# Patient Record
Sex: Female | Born: 1980 | Race: White | Hispanic: No | Marital: Married | Smoking: Never smoker
Health system: Southern US, Community
[De-identification: ages and names within clinical notes are randomized; demographics above are authoritative.]

## PROBLEM LIST (undated history)

## (undated) DIAGNOSIS — Z789 Other specified health status: Secondary | ICD-10-CM

## (undated) DIAGNOSIS — Z309 Encounter for contraceptive management, unspecified: Secondary | ICD-10-CM

## (undated) DIAGNOSIS — J45909 Unspecified asthma, uncomplicated: Secondary | ICD-10-CM

## (undated) DIAGNOSIS — O09529 Supervision of elderly multigravida, unspecified trimester: Secondary | ICD-10-CM

## (undated) DIAGNOSIS — J683 Other acute and subacute respiratory conditions due to chemicals, gases, fumes and vapors: Secondary | ICD-10-CM

## (undated) DIAGNOSIS — N644 Mastodynia: Secondary | ICD-10-CM

## (undated) DIAGNOSIS — R5383 Other fatigue: Secondary | ICD-10-CM

## (undated) DIAGNOSIS — T7840XA Allergy, unspecified, initial encounter: Secondary | ICD-10-CM

## (undated) HISTORY — DX: Other fatigue: R53.83

## (undated) HISTORY — PX: DILATION AND CURETTAGE OF UTERUS: SHX78

## (undated) HISTORY — PX: OTHER SURGICAL HISTORY: SHX169

## (undated) HISTORY — DX: Allergy, unspecified, initial encounter: T78.40XA

## (undated) HISTORY — DX: Unspecified asthma, uncomplicated: J45.909

## (undated) HISTORY — DX: Mastodynia: N64.4

## (undated) HISTORY — DX: Supervision of elderly multigravida, unspecified trimester: O09.529

## (undated) HISTORY — DX: Encounter for contraceptive management, unspecified: Z30.9

## (undated) HISTORY — DX: Other acute and subacute respiratory conditions due to chemicals, gases, fumes and vapors: J68.3

---

## 2006-03-06 ENCOUNTER — Encounter (INDEPENDENT_AMBULATORY_CARE_PROVIDER_SITE_OTHER): Payer: Self-pay | Admitting: Specialist

## 2006-03-06 ENCOUNTER — Ambulatory Visit (HOSPITAL_COMMUNITY): Admission: RE | Admit: 2006-03-06 | Discharge: 2006-03-06 | Payer: Self-pay | Admitting: Obstetrics & Gynecology

## 2006-12-10 ENCOUNTER — Other Ambulatory Visit: Admission: RE | Admit: 2006-12-10 | Discharge: 2006-12-10 | Payer: Self-pay | Admitting: Obstetrics and Gynecology

## 2007-03-25 ENCOUNTER — Inpatient Hospital Stay (HOSPITAL_COMMUNITY): Admission: AD | Admit: 2007-03-25 | Discharge: 2007-03-28 | Payer: Self-pay | Admitting: Obstetrics & Gynecology

## 2007-03-25 ENCOUNTER — Encounter: Payer: Self-pay | Admitting: Obstetrics & Gynecology

## 2007-03-25 ENCOUNTER — Ambulatory Visit: Payer: Self-pay | Admitting: Obstetrics and Gynecology

## 2008-02-17 ENCOUNTER — Other Ambulatory Visit: Admission: RE | Admit: 2008-02-17 | Discharge: 2008-02-17 | Payer: Self-pay | Admitting: Obstetrics and Gynecology

## 2008-08-09 ENCOUNTER — Ambulatory Visit (HOSPITAL_COMMUNITY): Admission: RE | Admit: 2008-08-09 | Discharge: 2008-08-09 | Payer: Self-pay | Admitting: Obstetrics & Gynecology

## 2009-05-09 ENCOUNTER — Other Ambulatory Visit: Admission: RE | Admit: 2009-05-09 | Discharge: 2009-05-09 | Payer: Self-pay | Admitting: Obstetrics and Gynecology

## 2009-12-27 ENCOUNTER — Ambulatory Visit: Payer: Self-pay | Admitting: Advanced Practice Midwife

## 2009-12-27 ENCOUNTER — Inpatient Hospital Stay (HOSPITAL_COMMUNITY): Admission: AD | Admit: 2009-12-27 | Discharge: 2009-12-29 | Payer: Self-pay | Admitting: Obstetrics and Gynecology

## 2010-06-22 LAB — CBC
HCT: 35.2 % — ABNORMAL LOW (ref 36.0–46.0)
MCH: 28.1 pg (ref 26.0–34.0)
MCV: 85.2 fL (ref 78.0–100.0)
RDW: 18.6 % — ABNORMAL HIGH (ref 11.5–15.5)
WBC: 17.3 10*3/uL — ABNORMAL HIGH (ref 4.0–10.5)

## 2010-08-22 NOTE — Op Note (Signed)
NAME:  Bailey Sullivan, Bailey Sullivan NO.:  [id number]   MEDICAL RECORD NO.:  [id number]          PATIENT TYPE:  INP   LOCATION:  9133                          FACILITY:  [facility]   PHYSICIAN:  Allie Bossier, MD        DATE OF BIRTH:  Sep 04, 1980   DATE OF PROCEDURE:  03/25/2007  DATE OF DISCHARGE:                               OPERATIVE REPORT   PREOPERATIVE DIAGNOSIS:  Fetal bradycardia at term, remote from  delivery, meconium.   POSTOP DIAGNOSIS:  Fetal bradycardia at term, remote from delivery,  meconium plus nuchal cord times one.   PROCEDURE:  Primary low transverse cesarean section.   SURGEON:  Nicholaus Bloom, M.D.   ASSISTANT:  Caren Griffins, C.N.M.   ANESTHESIA:  Oddono, MD, epidural.   COMPLICATIONS:  None.   ESTIMATED BLOOD LOSS:  500 mL.   SPECIMENS:  Cord blood, placenta and cord gas (7.31).  Please note the  patient is also a cord blood donor.   DETAILED PROCEDURE AND FINDINGS:  The fetal heart rate was noted to be  in the 70s for a prolonged deceleration (approximately 7 minutes).  It  responded initially to terbutaline and oxygen but recurrent deceleration  was noted.  She was taken to the operating room for a primary low  transverse cesarean section.  Her epidural was bolused for surgery  rapidly.  Her abdomen was prepped and draped in the usual sterile  fashion.  Her Foley catheter drained clear urine throughout.  Her  abdomen was prepped and draped in the usual sterile fashion.  After  adequate anesthesia was assured a transverse incision was made  approximately 2 cm above the symphysis pubis.  Incision was carried down  through the moderate amount of subcutaneous tissue to the fascia.  The  fascia was scored in the midline.  Fascial incision was extended  bilaterally  in the midline. The rectus muscles were separated in the  midline in a transverse fashion using electrosurgical technique.  Approximately 30% of the muscles in the midline was transected.  The  rest was left intact.   The peritoneum was entered with hemostats.  Peritoneal incision was  extended with the Bovie taking care to avoid the bladder.  Bladder blade  was placed.  A transverse incision was made on the well-developed lower  uterine segment.  Amniotomy was performed with hemostats.  Moderately  thick meconium was noted.  The baby had a vertex lie.  It was not  engaged in the pelvis yet.  Nuchal cord x1 was removed.  Mouth and  nostrils were suctioned prior to delivery of the shoulders.  Cord was  clamped and cut and the baby was transferred to the pediatrician for  care.  Apgars were 9 and 9 at one and five minutes.  Weight is pending.  Cord blood gas was obtained and the result was 7.31.   The placenta was extracted manually and given to the cord blood donor  personnel.  The uterus was left in situ.  The uterine interior was  cleaned with a dry lap sponge.  The bladder blade was placed and the  uterine incision was closed with two layers of zero chromic running  locking suture.  The second layer imbricated the first and excellent  hemostasis was noted.  The fascia was closed with a 0-0 Vicryl running  nonlocking suture.  No defects were palpable.  The subcu tissue was  irrigated, cleaned and dried.  It was infiltrated with 30 mL of 0.5%  Marcaine.  A subcuticular closure was done with 3-0 Vicryl suture.  Steri-Strips were placed across the skin incision and she was taken to  recovery room in stable condition.  The instrument, sponge and needle  counts were correct.  A Foley catheter drained clear urine throughout.  She tolerated the procedure well.      Allie Bossier, MD  Electronically Signed     MCD/MEDQ  D:  03/25/2007  T:  03/25/2007  Job:  [id number]

## 2010-08-22 NOTE — Discharge Summary (Signed)
[name], [name] NO.:  [id number]   MEDICAL RECORD NO.:  [id number]          PATIENT TYPE:  INP   LOCATION:  9133                          [facility]ACILITY:  [facility]   PHYSICIAN:  Bailey Sullivan, M.D. DATE OF BIRTH:  1981/01/10   DATE OF ADMISSION:  03/25/2007  DATE OF DISCHARGE:  03/28/2007                               DISCHARGE SUMMARY   REASON FOR ADMISSION:  Onset of labor.   PRENATAL PROCEDURES:  None.   HOSPITAL COURSE:  The patient is a 30 year old, G2, P0, admitted on  March 25, 2007 for spontaneous rupture of membranes at 2300 hours on  December 15.  The patient received prenatal care per [facility] with  onset of care at six weeks.  The patient's labor was augmented with  pitocin, however, the fetus had an episode of bradycardia lasting 7  minutes.  The bradycardia resolved with repositioning, and when pitocin  was turned off, a shot of terbutaline was given, but a second  deceleration occurred.  The patient was then taken to the operating room  for low transverse C-section.  Please see operative report for details  of the C-section.  A viable female infant with Apgars of 9 at 1 minute  and 9 at 5 minutes was delivered, and there was a nuchal cord x 1.  Three-vessel cord and the placenta were also delivered.  Estimated blood  loss was 100 cc.   PRENATAL LABS:  Blood type A positive, antibiotic negative, rubella  immune.  Hepatitis B surface antigen negative, RPR negative, HIV  negative.  Gonorrhea negative, chlamydia negative, group beta Strep  negative, one hour glucose tolerance test was 136.   The patient is breast feeding infant which is going well.  Her  contraception choice is Micronor pills.   POSTPARTUM PROCEDURES:  None.   COMPLICATIONS:  None.   DISCHARGE DIAGNOSIS:  Term pregnancy delivered.   DISCHARGE INFORMATION:  ACTIVITY:  No intercourse for six weeks.  DIET:  Routine.   DISCHARGE MEDICATIONS:  1. Micronor 0.35 take 1 tab at  the same time each day, start the      second Sunday after delivery.  2. Colace 100 mg take 1 tab b.i.d. as needed for constipation.  3. Ibuprofen 600 mg take 1 tab p.o. q. 6 hours as needed for mild      pain.  4. Percocet 5/325 take 1 tab q. 6 hours p.r.n. for moderate pain.  5. Continue prenatal vitamins while breast feeding.   DISCHARGE STATUS:  Well.   DISCHARGE INSTRUCTIONS:  Routine.  Discharge home.  The patient is to  follow up in six weeks at [facility].      Bailey Muir, MD      Bailey Sullivan, M.D.  Electronically Signed    SO/MEDQ  D:  03/28/2007  T:  03/28/2007  Job:  [id number]

## 2010-08-25 NOTE — Op Note (Signed)
Bailey, WINBORNE NO.:  [id number]   MEDICAL RECORD NO.:  [id number]          PATIENT TYPE:  AMB   LOCATION:  DAY                           FACILITY:  [facility]   PHYSICIAN:  Lazaro Arms, M.D.   DATE OF BIRTH:  09/20/1980   DATE OF PROCEDURE:  03/06/2006  DATE OF DISCHARGE:                               OPERATIVE REPORT   PREOPERATIVE DIAGNOSIS:  Missed abortion at 9 weeks.   POSTOPERATIVE DIAGNOSIS:  Missed abortion at 9 weeks.   PROCEDURE:  Cervical dilatation with suction and sharp curettage.   SURGEON:  Lazaro Arms, M.D.   ANESTHESIA:  General endotracheal.   FINDINGS:  The patient was seen in the office 2 days ago, had a previous  ultrasound 2 weeks ago which revealed positive cardiac activity.  Two  days ago there was no cardiac activity, no interval growth.  This  represents a missed AB, and they requested a D&C.   DESCRIPTION OF OPERATION:  The patient was taken to the operating room  and placed in the supine position, where she underwent general  endotracheal anesthesia, placed in the dorsal lithotomy position, and  prepped and draped in the usual sterile fashion.  A Graves speculum was  placed.  The cervix was grasped with a single-tooth tenaculum, a  paracervical block was placed, the cervix dilated serially to allow  passage of a 9 curved French suction curette.  Multiple passes were made  and products of conception were removed.  There was good uterine cry in  all areas.  The patient tolerated the procedure well.  She experienced  no bleeding, taken to recovery in good and stable condition, all counts  correct.      Lazaro Arms, M.D.  Electronically Signed     LHE/MEDQ  D:  03/06/2006  T:  03/06/2006  Job:  [id number]

## 2011-01-12 LAB — CBC
HCT: 28.5 — ABNORMAL LOW
MCHC: 34.6
MCV: 89.2
MCV: 89.6
Platelets: 286
RBC: 3.78 — ABNORMAL LOW
RDW: 15.2
RDW: 15.3

## 2011-01-12 LAB — RPR: RPR Ser Ql: NONREACTIVE

## 2011-01-23 ENCOUNTER — Other Ambulatory Visit: Payer: Self-pay | Admitting: Adult Health

## 2011-01-23 ENCOUNTER — Other Ambulatory Visit (HOSPITAL_COMMUNITY)
Admission: RE | Admit: 2011-01-23 | Discharge: 2011-01-23 | Disposition: A | Discharge status: Home / Self Care | Payer: 59 | Source: Ambulatory Visit | Attending: Obstetrics and Gynecology | Admitting: Obstetrics and Gynecology

## 2011-01-23 DIAGNOSIS — Z01419 Encounter for gynecological examination (general) (routine) without abnormal findings: Secondary | ICD-10-CM | POA: Insufficient documentation

## 2011-04-10 NOTE — L&D Delivery Note (Signed)
Delivery Note At 2:32 AM a viable and healthy female was delivered via Vaginal, Spontaneous Delivery (Presentation: ; Occiput Anterior).  APGAR: 8, 9; weight pending .   Placenta status: Intact, Spontaneous.  Cord: 3 vessels with the following complications: None.   Anesthesia:   Episiotomy: None Lacerations: 2nd degree perineal Suture Repair: 2.0 vicryl Est. Blood Loss (mL): 250  Mom to postpartum.  Baby to nursery-stable.  Bailey Sullivan 01/04/2012, 3:05 AM

## 2011-04-10 NOTE — L&D Delivery Note (Signed)
I was present for delivery and agree with above  

## 2011-05-16 LAB — OB RESULTS CONSOLE HEPATITIS B SURFACE ANTIGEN: Hepatitis B Surface Ag: NEGATIVE

## 2011-05-16 LAB — OB RESULTS CONSOLE ANTIBODY SCREEN: Antibody Screen: NEGATIVE

## 2011-05-16 LAB — OB RESULTS CONSOLE ABO/RH: RH Type: POSITIVE

## 2011-12-20 LAB — OB RESULTS CONSOLE GC/CHLAMYDIA: Chlamydia: NEGATIVE

## 2012-01-04 ENCOUNTER — Inpatient Hospital Stay (HOSPITAL_COMMUNITY)
Admission: AD | Admit: 2012-01-04 | Discharge: 2012-01-05 | DRG: 775 | Disposition: A | Discharge status: Home / Self Care | Payer: 59 | Source: Ambulatory Visit | Attending: Obstetrics & Gynecology | Admitting: Obstetrics & Gynecology

## 2012-01-04 ENCOUNTER — Encounter (HOSPITAL_COMMUNITY): Payer: Self-pay | Admitting: *Deleted

## 2012-01-04 ENCOUNTER — Inpatient Hospital Stay (HOSPITAL_COMMUNITY): Payer: 59 | Admitting: Anesthesiology

## 2012-01-04 ENCOUNTER — Encounter (HOSPITAL_COMMUNITY): Payer: Self-pay | Admitting: Anesthesiology

## 2012-01-04 DIAGNOSIS — IMO0001 Reserved for inherently not codable concepts without codable children: Secondary | ICD-10-CM

## 2012-01-04 HISTORY — DX: Other specified health status: Z78.9

## 2012-01-04 LAB — TYPE AND SCREEN: Antibody Screen: NEGATIVE

## 2012-01-04 LAB — CBC
HCT: 32.5 % — ABNORMAL LOW (ref 36.0–46.0)
MCH: 24.6 pg — ABNORMAL LOW (ref 26.0–34.0)
MCV: 76.1 fL — ABNORMAL LOW (ref 78.0–100.0)
RBC: 4.27 MIL/uL (ref 3.87–5.11)
WBC: 15.2 10*3/uL — ABNORMAL HIGH (ref 4.0–10.5)

## 2012-01-04 LAB — ABO/RH: ABO/RH(D): A POS

## 2012-01-04 MED ORDER — PHENYLEPHRINE 40 MCG/ML (10ML) SYRINGE FOR IV PUSH (FOR BLOOD PRESSURE SUPPORT)
80.0000 ug | PREFILLED_SYRINGE | INTRAVENOUS | Status: DC | PRN
  Filled 2012-01-04: qty 5

## 2012-01-04 MED ORDER — FENTANYL 2.5 MCG/ML BUPIVACAINE 1/10 % EPIDURAL INFUSION (WH - ANES)
14.0000 mL/h | INTRAMUSCULAR | Status: DC
  Filled 2012-01-04: qty 60

## 2012-01-04 MED ORDER — ZOLPIDEM TARTRATE 5 MG PO TABS: 5.0000 mg | ORAL_TABLET | Freq: Every evening | ORAL | Status: DC | PRN

## 2012-01-04 MED ORDER — LIDOCAINE HCL (PF) 1 % IJ SOLN
INTRAMUSCULAR | Status: DC | PRN
  Administered 2012-01-04 (×2): 9 mL

## 2012-01-04 MED ORDER — ONDANSETRON HCL 4 MG PO TABS: 4.0000 mg | ORAL_TABLET | ORAL | Status: DC | PRN

## 2012-01-04 MED ORDER — OXYCODONE-ACETAMINOPHEN 5-325 MG PO TABS
1.0000 | ORAL_TABLET | ORAL | Status: DC | PRN
  Administered 2012-01-04 – 2012-01-05 (×3): 1 via ORAL
  Filled 2012-01-04 (×4): qty 1

## 2012-01-04 MED ORDER — INFLUENZA VIRUS VACC SPLIT PF IM SUSP
0.5000 mL | INTRAMUSCULAR | Status: AC
  Administered 2012-01-05: 0.5 mL via INTRAMUSCULAR
  Filled 2012-01-04: qty 0.5

## 2012-01-04 MED ORDER — IBUPROFEN 600 MG PO TABS
600.0000 mg | ORAL_TABLET | Freq: Four times a day (QID) | ORAL | Status: DC
  Administered 2012-01-04 – 2012-01-05 (×5): 600 mg via ORAL
  Filled 2012-01-04 (×5): qty 1

## 2012-01-04 MED ORDER — ACETAMINOPHEN 325 MG PO TABS: 650.0000 mg | ORAL_TABLET | ORAL | Status: DC | PRN

## 2012-01-04 MED ORDER — PRENATAL MULTIVITAMIN CH
1.0000 | ORAL_TABLET | Freq: Every day | ORAL | Status: DC
  Administered 2012-01-04 – 2012-01-05 (×2): 1 via ORAL
  Filled 2012-01-04 (×2): qty 1

## 2012-01-04 MED ORDER — CITRIC ACID-SODIUM CITRATE 334-500 MG/5ML PO SOLN: 30.0000 mL | ORAL | Status: DC | PRN

## 2012-01-04 MED ORDER — SENNOSIDES-DOCUSATE SODIUM 8.6-50 MG PO TABS
2.0000 | ORAL_TABLET | Freq: Every day | ORAL | Status: DC
  Administered 2012-01-04: 2 via ORAL

## 2012-01-04 MED ORDER — DIBUCAINE 1 % RE OINT: 1.0000 "application " | TOPICAL_OINTMENT | RECTAL | Status: DC | PRN

## 2012-01-04 MED ORDER — ONDANSETRON HCL 4 MG/2ML IJ SOLN: 4.0000 mg | Freq: Four times a day (QID) | INTRAMUSCULAR | Status: DC | PRN

## 2012-01-04 MED ORDER — ONDANSETRON HCL 4 MG/2ML IJ SOLN: 4.0000 mg | INTRAMUSCULAR | Status: DC | PRN

## 2012-01-04 MED ORDER — FERROUS SULFATE 325 (65 FE) MG PO TABS
325.0000 mg | ORAL_TABLET | Freq: Two times a day (BID) | ORAL | Status: DC
  Administered 2012-01-04 – 2012-01-05 (×3): 325 mg via ORAL
  Filled 2012-01-04 (×3): qty 1

## 2012-01-04 MED ORDER — PHENYLEPHRINE 40 MCG/ML (10ML) SYRINGE FOR IV PUSH (FOR BLOOD PRESSURE SUPPORT): 80.0000 ug | PREFILLED_SYRINGE | INTRAVENOUS | Status: DC | PRN

## 2012-01-04 MED ORDER — MEASLES, MUMPS & RUBELLA VAC ~~LOC~~ INJ
0.5000 mL | INJECTION | Freq: Once | SUBCUTANEOUS | Status: DC
  Filled 2012-01-04: qty 0.5

## 2012-01-04 MED ORDER — FENTANYL CITRATE 0.05 MG/ML IJ SOLN
100.0000 ug | INTRAMUSCULAR | Status: DC | PRN
  Administered 2012-01-04: 100 ug via INTRAVENOUS

## 2012-01-04 MED ORDER — FENTANYL 2.5 MCG/ML BUPIVACAINE 1/10 % EPIDURAL INFUSION (WH - ANES)
INTRAMUSCULAR | Status: DC | PRN
  Administered 2012-01-04: 14 mL/h via EPIDURAL

## 2012-01-04 MED ORDER — BENZOCAINE-MENTHOL 20-0.5 % EX AERO
1.0000 "application " | INHALATION_SPRAY | CUTANEOUS | Status: DC | PRN
  Administered 2012-01-04: 1 via TOPICAL
  Filled 2012-01-04: qty 56

## 2012-01-04 MED ORDER — DIPHENHYDRAMINE HCL 25 MG PO CAPS: 25.0000 mg | ORAL_CAPSULE | Freq: Four times a day (QID) | ORAL | Status: DC | PRN

## 2012-01-04 MED ORDER — EPHEDRINE 5 MG/ML INJ
10.0000 mg | INTRAVENOUS | Status: DC | PRN
  Filled 2012-01-04: qty 4

## 2012-01-04 MED ORDER — LACTATED RINGERS IV SOLN: 500.0000 mL | Freq: Once | INTRAVENOUS | Status: DC

## 2012-01-04 MED ORDER — OXYTOCIN BOLUS FROM INFUSION
500.0000 mL | Freq: Once | INTRAVENOUS | Status: DC
  Filled 2012-01-04: qty 500

## 2012-01-04 MED ORDER — LIDOCAINE HCL (PF) 1 % IJ SOLN
30.0000 mL | INTRAMUSCULAR | Status: DC | PRN
  Filled 2012-01-04: qty 30

## 2012-01-04 MED ORDER — TETANUS-DIPHTH-ACELL PERTUSSIS 5-2.5-18.5 LF-MCG/0.5 IM SUSP
0.5000 mL | Freq: Once | INTRAMUSCULAR | Status: AC
  Administered 2012-01-05: 0.5 mL via INTRAMUSCULAR
  Filled 2012-01-04: qty 0.5

## 2012-01-04 MED ORDER — METHYLERGONOVINE MALEATE 0.2 MG/ML IJ SOLN: 0.2000 mg | INTRAMUSCULAR | Status: DC | PRN

## 2012-01-04 MED ORDER — LANOLIN HYDROUS EX OINT: TOPICAL_OINTMENT | CUTANEOUS | Status: DC | PRN

## 2012-01-04 MED ORDER — FENTANYL CITRATE 0.05 MG/ML IJ SOLN
INTRAMUSCULAR | Status: AC
  Filled 2012-01-04: qty 2

## 2012-01-04 MED ORDER — IBUPROFEN 600 MG PO TABS: 600.0000 mg | ORAL_TABLET | Freq: Four times a day (QID) | ORAL | Status: DC | PRN

## 2012-01-04 MED ORDER — OXYTOCIN 40 UNITS IN LACTATED RINGERS INFUSION - SIMPLE MED
62.5000 mL/h | Freq: Once | INTRAVENOUS | Status: AC
  Administered 2012-01-04: 62.5 mL/h via INTRAVENOUS
  Filled 2012-01-04: qty 1000

## 2012-01-04 MED ORDER — LACTATED RINGERS IV SOLN
INTRAVENOUS | Status: DC
  Administered 2012-01-04: 01:00:00 via INTRAVENOUS

## 2012-01-04 MED ORDER — SIMETHICONE 80 MG PO CHEW: 80.0000 mg | CHEWABLE_TABLET | ORAL | Status: DC | PRN

## 2012-01-04 MED ORDER — WITCH HAZEL-GLYCERIN EX PADS: 1.0000 "application " | MEDICATED_PAD | CUTANEOUS | Status: DC | PRN

## 2012-01-04 MED ORDER — LACTATED RINGERS IV SOLN
500.0000 mL | INTRAVENOUS | Status: DC | PRN
Start: 2012-01-04 — End: 2012-01-04

## 2012-01-04 MED ORDER — OXYCODONE-ACETAMINOPHEN 5-325 MG PO TABS: 1.0000 | ORAL_TABLET | ORAL | Status: DC | PRN

## 2012-01-04 MED ORDER — EPHEDRINE 5 MG/ML INJ: 10.0000 mg | INTRAVENOUS | Status: DC | PRN

## 2012-01-04 MED ORDER — METHYLERGONOVINE MALEATE 0.2 MG PO TABS: 0.2000 mg | ORAL_TABLET | ORAL | Status: DC | PRN

## 2012-01-04 MED ORDER — MAGNESIUM HYDROXIDE 400 MG/5ML PO SUSP: 30.0000 mL | ORAL | Status: DC | PRN

## 2012-01-04 MED ORDER — DIPHENHYDRAMINE HCL 50 MG/ML IJ SOLN: 12.5000 mg | INTRAMUSCULAR | Status: DC | PRN

## 2012-01-04 NOTE — MAU Note (Signed)
Contractions q 2 minutes,

## 2012-01-04 NOTE — Anesthesia Preprocedure Evaluation (Signed)

## 2012-01-04 NOTE — H&P (Signed)
  Bailey Sullivan is a 31 y.o. female [phone] with IUP at [redacted]w[redacted]d presenting for contractions for 3 hours,getting stronger.  membranes are intact, with active fetal movement.   [facility]  [facility] 6 wks  Prenonenatal History/Complications:  Past Medical History: none  Past Surgical History: C section Obstetrical History: OB History    Grav Para Term Preterm Abortions TAB SAB Ect Mult Living   4 2 2  0 1 0 1 0  2      Gynecological History: OB History    Grav Para Term Preterm Abortions TAB SAB Ect Mult Living   4 2 2  0 1 0 1 0  2      Social History: History   Social History  . Marital Status: Married    Spouse Name: N/A    Number of Children: N/A  . Years of Education: N/A   Social History Main Topics  . Smoking status: Not on file  . Smokeless tobacco: Not on file  . Alcohol Use: Not on file  . Drug Use: Not on file  . Sexually Active: Not on file   Other Topics Concern  . Not on file   Social History Narrative  . No narrative on file    Family History: No family history on file.  Allergies: Allergies  Allergen Reactions  . Amoxicillin Hives  . Sulfa Antibiotics Hives    Prescriptions prior to admission  Medication Sig Dispense Refill  . cetirizine (ZYRTEC) 10 MG tablet Take 10 mg by mouth daily.      Marland Kitchen omeprazole (PRILOSEC) 20 MG capsule Take 20 mg by mouth daily.      . Prenatal Vit-Fe Fumarate-FA (PRENATAL PO) Take by mouth.        Review of Systems - Negative   Blood pressure 126/69, pulse 93, resp. rate 18, height 5\' 3"  (1.6 m), weight 182 lb (82.555 kg), SpO2 100.00%. General appearance: alert, cooperative and no distress Lungs: clear to auscultation bilaterally Heart: regular rate and rhythm Abdomen: soft, non-tender; bowel sounds normal Extremities: Homans sign is negative, no sign of DVT DTR's 2+ Presentation: cephalic Fetal monitoringBaseline: 140 bpm, Variability: Good {> 6 bpm), Accelerations: Reactive and Decelerations:  Absent Uterine activity q 2-3 mnutes, moderate Dilation: 5 Effacement (%): 100 Station: -1 Exam by:: Drenda Freeze Dishmon CNM   Prenatal labs: ABO, Rh:  A+ Antibody:  neg Rubella:  immune RPR:   neg HBsAg:   neg HIV:   neg GBS:   neg 2 hr Glucola 72/122/121 Genetic screening  normal Anatomy US normal  .Assessment: Bailey Sullivan is a 31 y.o. [id number] with an IUP at [redacted]w[redacted]d presenting for active labor  Plan: Expectant management   CRESENZO-DISHMAN,Kreig Parson 01/04/2012, 12:46 AM

## 2012-01-04 NOTE — Anesthesia Postprocedure Evaluation (Signed)
  Anesthesia Post-op Note  Patient: Bailey Sullivan  Procedure(s) Performed: * No procedures listed *  Patient Location: Mother/Baby  Anesthesia Type: Epidural  Level of Consciousness: awake  Airway and Oxygen Therapy: Patient Spontanous Breathing  Post-op Pain: mild  Post-op Assessment: Patient's Cardiovascular Status Stable and Respiratory Function Stable  Post-op Vital Signs: stable  Complications: No apparent anesthesia complications

## 2012-01-04 NOTE — Anesthesia Procedure Notes (Signed)
Epidural Patient location during procedure: OB Start time: 01/04/2012 1:59 AM End time: 01/04/2012 2:03 AM  Staffing Anesthesiologist: Sandrea Hughs Performed by: anesthesiologist   Preanesthetic Checklist Completed: patient identified, site marked, surgical consent, pre-op evaluation, timeout performed, IV checked, risks and benefits discussed and monitors and equipment checked  Epidural Patient position: sitting Prep: site prepped and draped and DuraPrep Patient monitoring: continuous pulse ox and blood pressure Approach: midline Injection technique: LOR air  Needle:  Needle type: Tuohy  Needle gauge: 17 G Needle length: 9 cm and 9 Needle insertion depth: 7 cm Catheter type: closed end flexible Catheter size: 19 Gauge Catheter at skin depth: 12 cm Test dose: negative and Other  Assessment Sensory level: T8 Events: blood not aspirated, injection not painful, no injection resistance, negative IV test and no paresthesia  Additional Notes Reason for block:procedure for pain

## 2012-01-05 LAB — CBC
Hemoglobin: 9.1 g/dL — ABNORMAL LOW (ref 12.0–15.0)
Platelets: 355 10*3/uL (ref 150–400)
RBC: 3.71 MIL/uL — ABNORMAL LOW (ref 3.87–5.11)

## 2012-01-05 MED ORDER — IBUPROFEN 600 MG PO TABS: 600.0000 mg | ORAL_TABLET | Freq: Four times a day (QID) | ORAL | Status: DC

## 2012-01-05 MED ORDER — SENNOSIDES-DOCUSATE SODIUM 8.6-50 MG PO TABS: 2.0000 | ORAL_TABLET | Freq: Every day | ORAL | Status: DC

## 2012-01-05 NOTE — Discharge Summary (Signed)
Obstetric Discharge Summary Reason for Admission: onset of labor Prenatal Procedures: ultrasound Intrapartum Procedures: spontaneous vaginal delivery Postpartum Procedures: none Complications-Operative and Postpartum: 2nd degree perineal laceration Hemoglobin  Date Value Range Status  01/05/2012 9.1* 12.0 - 15.0 g/dL Final     HCT  Date Value Range Status  01/05/2012 28.8* 36.0 - 46.0 % Final    Physical Exam:  General: alert, cooperative and no distress Lochia: appropriate Uterine Fundus: firm Incision: n/a DVT Evaluation: No evidence of DVT seen on physical exam.  Discharge Diagnoses: Term Pregnancy-delivered  Discharge Information: Date: 01/05/2012 Activity: pelvic rest Diet: routine Medications: PNV, Ibuprofen and Colace Condition: stable Instructions: refer to practice specific booklet Discharge to: home   Newborn Data: Live born female  Birth Weight: 7 lb 1.8 oz (3225 g) APGAR: 8, 9  Home with mother.  MCGILL,JACQUELYN 01/05/2012, 8:56 AM  I examined patient and agree with resident's note and plan of care. Denies sob, dizziness, or cp r/t anemia.  Pt breastfeeding, thinking about micronor for contraception, circumcision at FT.  Cheral Marker, CNM, WHNP-BC

## 2012-01-06 NOTE — H&P (Signed)
Attestation of Attending Supervision of Advanced Practitioner (CNM/NP): Evaluation and management procedures were performed by the Advanced Practitioner under my supervision and collaboration.  I have reviewed the Advanced Practitioner's note and chart, and I agree with the management and plan.  HARRAWAY-SMITH, Par[place] Po 7:16 PM     

## 2012-01-17 ENCOUNTER — Telehealth (HOSPITAL_COMMUNITY): Payer: Self-pay | Admitting: *Deleted

## 2012-01-17 NOTE — Telephone Encounter (Signed)
Resolving episode.

## 2012-06-10 ENCOUNTER — Encounter: Payer: Self-pay | Admitting: *Deleted

## 2012-10-23 ENCOUNTER — Telehealth: Payer: Self-pay | Admitting: Adult Health

## 2012-10-23 NOTE — Telephone Encounter (Signed)
Spoke with pt. Discomfort under left arm x 1 month; pain on and off. Still breastfeeding. No redness or hardness in breast. Advised to schedule an appt. No one up front to schedule appt. Pt will call tomorrow and schedule appt. JSY

## 2012-10-28 ENCOUNTER — Ambulatory Visit (INDEPENDENT_AMBULATORY_CARE_PROVIDER_SITE_OTHER): Payer: 59 | Admitting: Adult Health

## 2012-10-28 ENCOUNTER — Encounter: Payer: Self-pay | Admitting: Adult Health

## 2012-10-28 VITALS — BP 120/80 | Ht 63.0 in | Wt 136.0 lb

## 2012-10-28 DIAGNOSIS — N644 Mastodynia: Secondary | ICD-10-CM

## 2012-10-28 HISTORY — DX: Mastodynia: N64.4

## 2012-10-28 NOTE — Progress Notes (Signed)
Subjective:     Patient ID: Bailey Sullivan, female   DOB: March 24, 1981, 32 y.o.   MRN: [id number]  HPI Kaitlyn is in today complaining of pain in left breast at times, her son is 53 months old and she is still breastfeeding.  Review of Systems Positive in HPI Reviewed past medical,surgical, social and family history. Reviewed medications and allergies.     Objective:   Physical Exam BP 120/80  Ht 5\' 3"  (1.6 m)  Wt 136 lb (61.689 kg)  BMI 24.1 kg/m2  LMP 09/30/2012  Breastfeeding? Yes    Skin warm and dry,  Breasts:no dominate palpable mass, retraction or nipple discharge on the right, there is some irregularities in UOQ,of the right breast.On the left breast there is irregularities in UOQ and a small 2 cm tender area at 2 o'clock 3 finger breaths from the areola,no retraction or nipple discharge, the left nipple inverts and has since first child and breast feeding, but it pops out easily. Discussed that I felt it may be a cyst and we could [place] it now or follow up in 1 month to re check and if it is still there get there [place] then.She will follow up in 1 month. Assessment:    Pain in left breast ?cyst    Plan:     Follow up in month, if persists will get [place] Call before if any changes or concerns Review handout on breast cyst

## 2012-10-28 NOTE — Patient Instructions (Addendum)
Breast Cyst A breast cyst is a sac in your breast that is filled with fluid. This is common in women. Women can have one or many cysts. When the breasts contain many cysts, it is usually due to a noncancerous (benign) condition called fibrocystic change. These lumps form under the influence of female hormones (estrogen and progesterone). The lumps are most often located in the upper, outer portion of the breast. They are often more swollen, painful, and tender before your period starts. They usually disappear after menopause, unless you are on hormone therapy. Different types of cysts:  Macrocysts. These are cysts that are about 2 inches (5.1 cm) in diameter.  Microcysts. These are tiny cysts that you cannot feel, but that are seen with a mammogram or an ultrasound.  Galactocele. This is a cyst containing milk, which develops when and if you suddenly stop breastfeeding.  Sebaceous cyst of the skin (not in the breast tissue itself). These are not cancerous. Breast cysts do not increase your chance of getting breast cancer. However, they must be followed and treated closely, because a cyst can be cancerous. Be sure to see your caregiver for follow-up care as recommended.  CAUSES   It is not completely known what causes a breast cyst.  Estrogen may influence the development of a breast cyst.  An overgrowth of milk glands and connective tissue in the breast can block the milk glands, causing them to fill with fluid.  Scar tissue in the breast from previous surgery may block the glands, causing a cyst. SYMPTOMS   Feeling a smooth, round, soft lump (like a grape) in the breast that is easily moveable.  Breast discomfort or pain, especially in the area of the cyst.  Increase in size of the lump before your menstrual period, and decrease in size after your menstrual period. DIAGNOSIS   The cyst can be felt during an exam by your caregiver.  Mammogram (breast X-ray).  Ultrasound.  Removing  fluid from the cyst with a needle (fine needle aspiration). TREATMENT   Your caregiver may feel there is no reason for treatment. He or she may watch to see if it goes away on its own.  Hormone treatment.  Needle aspiration. There is a 40% chance of the cyst recurring after aspiration.  Surgery to remove the whole cyst. HOME CARE INSTRUCTIONS   Get a yearly exam by your caregiver.  Practice "breast self-awareness." This means understanding the normal appearance and feel of your breasts and may include breast self-examination.  Have a clinical breast exam (CBE) by a caregiver every 1 to 3 years if you are 20 to 34 years of age. After age 40, you should have a CBE every year.  Get mammogram tests as directed by your caregiver.  Only take over-the-counter pain medicine as directed by your caregiver.  Wear a good support bra, especially when exercising.  Avoid caffeine.  Reduce your salt intake, especially before your menstrual period. Too much salt can cause fluid retention, breast swelling, and discomfort. SEEK MEDICAL CARE IF:   You feel, or think you feel, a lump in your breast.  You notice that both breasts look different than usual.  You notice that both breasts feel different than before.  Your breast is still causing pain, after your menstrual period is over.  You need medicine for breast pain and swelling that occurs with your menstrual period. SEEK IMMEDIATE MEDICAL CARE IF:   You develop severe pain, tenderness, redness, or warmth in your   breast.  You develop nipple discharge or bleeding.  Your breast lump becomes hard and painful.  You find new lumps or bumps that were not there before.  You feel lumps in your armpit (axilla).  You notice dimpling or wrinkling of the breast or nipple.  You have a fever. Document Released: 03/26/2005 Document Revised: 06/18/2011 Document Reviewed: 07/16/2008 [facility] Patient Information 2014 [place], [place]. Follow up in  1 month to recheck breast

## 2012-11-06 ENCOUNTER — Telehealth: Payer: Self-pay | Admitting: Adult Health

## 2012-11-06 NOTE — Telephone Encounter (Signed)
Has noticed another ?cyst still breast feeding, lets watch if persists call for appt next week

## 2012-11-25 ENCOUNTER — Encounter: Payer: Self-pay | Admitting: Adult Health

## 2012-11-25 ENCOUNTER — Ambulatory Visit (INDEPENDENT_AMBULATORY_CARE_PROVIDER_SITE_OTHER): Payer: 59 | Admitting: Adult Health

## 2012-11-25 VITALS — BP 120/80 | Ht 63.0 in | Wt 136.0 lb

## 2012-11-25 DIAGNOSIS — R5383 Other fatigue: Secondary | ICD-10-CM

## 2012-11-25 DIAGNOSIS — R5381 Other malaise: Secondary | ICD-10-CM

## 2012-11-25 HISTORY — DX: Other fatigue: R53.83

## 2012-11-25 LAB — COMPREHENSIVE METABOLIC PANEL
Albumin: 4.3 g/dL (ref 3.5–5.2)
CO2: 24 mEq/L (ref 19–32)
Calcium: 9.4 mg/dL (ref 8.4–10.5)
Chloride: 104 mEq/L (ref 96–112)
Glucose, Bld: 78 mg/dL (ref 70–99)
Potassium: 4.1 mEq/L (ref 3.5–5.3)
Sodium: 137 mEq/L (ref 135–145)
Total Protein: 6.9 g/dL (ref 6.0–8.3)

## 2012-11-25 LAB — CBC
Hemoglobin: 13.4 g/dL (ref 12.0–15.0)
MCH: 29.1 pg (ref 26.0–34.0)
RBC: 4.6 MIL/uL (ref 3.87–5.11)
WBC: 10.9 10*3/uL — ABNORMAL HIGH (ref 4.0–10.5)

## 2012-11-25 LAB — TSH: TSH: 1.653 u[IU]/mL (ref 0.350–4.500)

## 2012-11-25 NOTE — Progress Notes (Signed)
Subjective:     Patient ID: Bailey Sullivan, female   DOB: March 07, 1981, 32 y.o.   MRN: [id number]  HPI Bailey Sullivan is back for breast exam and she says the pain has been better for the last 2 weeks or so,she is complaining of fatigue and achy feeling, and hands tingle at times.  Review of Systems Positives in HPI Reviewed past medical,surgical, social and family history. Reviewed medications and allergies.     Objective:   Physical Exam BP 120/80  Ht 5\' 3"  (1.6 m)  Wt 136 lb (61.689 kg)  BMI 24.1 kg/m2  LMP 09/30/2012  Breastfeeding? YesSkin warm and dry,neck: mid line trachea with normal thyroid,breast: no dominate mass, retractation or nipple discharge,she does have regular irregularities in left breast but prior mass resolved,She is still breast feeding, will watch for now.    Assessment:    Fatigue Resolved breast mass    Plan:    Check CBC,CMP,TSH will talk in am about labs Follow up prn

## 2012-11-25 NOTE — Patient Instructions (Addendum)

## 2012-11-26 ENCOUNTER — Telehealth: Payer: Self-pay | Admitting: Adult Health

## 2012-11-26 NOTE — Telephone Encounter (Signed)
Left message that labs looked good 

## 2013-02-13 ENCOUNTER — Other Ambulatory Visit: Payer: Self-pay | Admitting: Advanced Practice Midwife

## 2013-02-17 ENCOUNTER — Other Ambulatory Visit: Payer: Self-pay | Admitting: *Deleted

## 2013-02-18 MED ORDER — NORETHINDRONE 0.35 MG PO TABS: 1.0000 | ORAL_TABLET | Freq: Every day | ORAL | Status: DC

## 2013-02-26 ENCOUNTER — Ambulatory Visit (INDEPENDENT_AMBULATORY_CARE_PROVIDER_SITE_OTHER): Payer: 59

## 2013-02-26 DIAGNOSIS — Z23 Encounter for immunization: Secondary | ICD-10-CM

## 2013-03-20 ENCOUNTER — Other Ambulatory Visit: Payer: Self-pay | Admitting: Advanced Practice Midwife

## 2013-08-13 ENCOUNTER — Encounter: Payer: Self-pay | Admitting: Family Medicine

## 2013-08-13 ENCOUNTER — Ambulatory Visit (INDEPENDENT_AMBULATORY_CARE_PROVIDER_SITE_OTHER): Payer: 59 | Admitting: Family Medicine

## 2013-08-13 VITALS — BP 110/78 | Temp 98.8°F | Ht 63.0 in | Wt 142.0 lb

## 2013-08-13 DIAGNOSIS — J069 Acute upper respiratory infection, unspecified: Secondary | ICD-10-CM

## 2013-08-13 DIAGNOSIS — J019 Acute sinusitis, unspecified: Secondary | ICD-10-CM

## 2013-08-13 MED ORDER — LEVOFLOXACIN 500 MG PO TABS: 500.0000 mg | ORAL_TABLET | Freq: Every day | ORAL | Status: AC

## 2013-08-13 NOTE — Progress Notes (Signed)
   Subjective:    Patient ID: Bailey Sullivan, female    DOB: 1980/05/12, 33 y.o.   MRN: [id number]  Cough This is a new problem. The current episode started 1 to 4 weeks ago. Associated symptoms include chills, a fever, headaches, myalgias, rhinorrhea and a sore throat. Pertinent negatives include no chest pain, ear pain, shortness of breath or wheezing. Associated symptoms comments: Eye drainage, nasal congestion. Treatments tried: sudafed, robuttism, benadrly, allergy med. The treatment provided no relief.   Pressure in sinus    Review of Systems  Constitutional: Positive for fever and chills. Negative for activity change.  HENT: Positive for congestion, rhinorrhea and sore throat. Negative for ear pain.   Eyes: Negative for discharge.  Respiratory: Positive for cough. Negative for shortness of breath and wheezing.   Cardiovascular: Negative for chest pain.  Musculoskeletal: Positive for myalgias.  Neurological: Positive for headaches.       Objective:   Physical Exam  Nursing note and vitals reviewed. Constitutional: She appears well-developed.  HENT:  Head: Normocephalic.  Nose: Nose normal.  Mouth/Throat: Oropharynx is clear and moist. No oropharyngeal exudate.  Neck: Neck supple.  Cardiovascular: Normal rate and normal heart sounds.   No murmur heard. Pulmonary/Chest: Effort normal and breath sounds normal. She has no wheezes.  Lymphadenopathy:    She has no cervical adenopathy.  Skin: Skin is warm and dry.          Assessment & Plan:  1. Acute sinus infection Levaquin prescribed warning signs discussed  2. Acute upper respiratory infections of unspecified site Underlying viral illness may take several days for cough go away warning signs of pneumonia discuss

## 2013-10-07 ENCOUNTER — Ambulatory Visit (INDEPENDENT_AMBULATORY_CARE_PROVIDER_SITE_OTHER): Payer: 59 | Admitting: Adult Health

## 2013-10-07 ENCOUNTER — Encounter: Payer: Self-pay | Admitting: Adult Health

## 2013-10-07 VITALS — BP 110/78 | Ht 63.0 in | Wt 146.0 lb

## 2013-10-07 DIAGNOSIS — Z3201 Encounter for pregnancy test, result positive: Secondary | ICD-10-CM

## 2013-10-07 LAB — POCT URINE PREGNANCY: Preg Test, Ur: POSITIVE

## 2013-10-07 NOTE — Progress Notes (Signed)
Pt here for pregnancy test. Positive result. Pt reports no spotting or cramping. Advised can have cramping and spotting in early pregnancy, that's just everything getting settled into place. Advised if she notices this, increase fluids, take it easy, and call office. Pt reports understanding. [place]

## 2013-10-12 ENCOUNTER — Other Ambulatory Visit: Payer: Self-pay | Admitting: Adult Health

## 2013-10-12 DIAGNOSIS — O3680X0 Pregnancy with inconclusive fetal viability, not applicable or unspecified: Secondary | ICD-10-CM

## 2013-10-15 ENCOUNTER — Encounter: Payer: Self-pay | Admitting: Adult Health

## 2013-10-15 ENCOUNTER — Ambulatory Visit (INDEPENDENT_AMBULATORY_CARE_PROVIDER_SITE_OTHER): Payer: 59 | Admitting: Adult Health

## 2013-10-15 ENCOUNTER — Other Ambulatory Visit: Payer: Self-pay | Admitting: Adult Health

## 2013-10-15 ENCOUNTER — Other Ambulatory Visit (HOSPITAL_COMMUNITY)
Admission: RE | Admit: 2013-10-15 | Discharge: 2013-10-15 | Disposition: A | Discharge status: Home / Self Care | Payer: 59 | Source: Ambulatory Visit | Attending: Adult Health | Admitting: Adult Health

## 2013-10-15 ENCOUNTER — Ambulatory Visit (INDEPENDENT_AMBULATORY_CARE_PROVIDER_SITE_OTHER): Payer: 59

## 2013-10-15 VITALS — BP 112/68 | Wt 144.0 lb

## 2013-10-15 DIAGNOSIS — Z1151 Encounter for screening for human papillomavirus (HPV): Secondary | ICD-10-CM | POA: Diagnosis present

## 2013-10-15 DIAGNOSIS — O09529 Supervision of elderly multigravida, unspecified trimester: Secondary | ICD-10-CM

## 2013-10-15 DIAGNOSIS — O34219 Maternal care for unspecified type scar from previous cesarean delivery: Secondary | ICD-10-CM

## 2013-10-15 DIAGNOSIS — Z1389 Encounter for screening for other disorder: Secondary | ICD-10-CM

## 2013-10-15 DIAGNOSIS — O3680X0 Pregnancy with inconclusive fetal viability, not applicable or unspecified: Secondary | ICD-10-CM

## 2013-10-15 DIAGNOSIS — O09521 Supervision of elderly multigravida, first trimester: Secondary | ICD-10-CM

## 2013-10-15 DIAGNOSIS — Z113 Encounter for screening for infections with a predominantly sexual mode of transmission: Secondary | ICD-10-CM | POA: Insufficient documentation

## 2013-10-15 DIAGNOSIS — Z3480 Encounter for supervision of other normal pregnancy, unspecified trimester: Secondary | ICD-10-CM

## 2013-10-15 DIAGNOSIS — Z01419 Encounter for gynecological examination (general) (routine) without abnormal findings: Secondary | ICD-10-CM | POA: Insufficient documentation

## 2013-10-15 DIAGNOSIS — Z3481 Encounter for supervision of other normal pregnancy, first trimester: Secondary | ICD-10-CM

## 2013-10-15 DIAGNOSIS — Z331 Pregnant state, incidental: Secondary | ICD-10-CM

## 2013-10-15 HISTORY — DX: Supervision of elderly multigravida, unspecified trimester: O09.529

## 2013-10-15 LAB — POCT URINALYSIS DIPSTICK
Blood, UA: NEGATIVE
GLUCOSE UA: NEGATIVE
Ketones, UA: NEGATIVE
Leukocytes, UA: NEGATIVE
Nitrite, UA: NEGATIVE
Protein, UA: NEGATIVE

## 2013-10-15 LAB — CBC
HCT: 41 % (ref 36.0–46.0)
HEMOGLOBIN: 13.8 g/dL (ref 12.0–15.0)
MCH: 29.5 pg (ref 26.0–34.0)
MCHC: 33.7 g/dL (ref 30.0–36.0)
MCV: 87.6 fL (ref 78.0–100.0)
Platelets: 355 10*3/uL (ref 150–400)
RBC: 4.68 MIL/uL (ref 3.87–5.11)
RDW: 14 % (ref 11.5–15.5)
WBC: 13.4 10*3/uL — ABNORMAL HIGH (ref 4.0–10.5)

## 2013-10-15 NOTE — Progress Notes (Signed)
Subjective:  Bailey Sullivan is a 33 y.o. [phone] Caucasian female at [redacted]w[redacted]d by [place] being seen today for her first obstetrical visit.  Her obstetrical history is significant for advanced maternal age.  Pregnancy history fully reviewed.  Patient reports nausea.,Will give samples diclegis, 4 boxes lot # [id number] exp 1/31.Denies vb, cramping, uti s/s, abnormal/malodorous vag d/c, or vulvovaginal itching/irritation.  BP 112/68  Wt 144 lb (65.318 kg)  LMP 08/02/2013  HISTORY: OB History  Gravida Para Term Preterm AB SAB TAB Ectopic Multiple Living  5 3 3  0 1 1 0 0  3    # Outcome Date GA Lbr Len/2nd Weight Sex Delivery Anes PTL Lv  5 CUR           4 TRM 01/04/12 [redacted]w[redacted]d 07:18 / 00:14 7 lb 1.8 oz (3.225 kg) M SVD EPI  Y  3 SAB           2 TRM      VBAC     1 TRM      LTCS        Past Medical History  Diagnosis Date  . No pertinent past medical history   . Reactive airways dysfunction syndrome   . Allergy   . Breast pain, left 10/28/2012    Still breast feeding ?cyst will re check in 1 month if still there will get [place]  . Fatigue 11/25/2012  . AMA (advanced maternal age) multigravida 35+ 10/15/2013   Past Surgical History  Procedure Laterality Date  . Cesarean section    . Wisom teeth     Family History  Problem Relation Age of Onset  . Cancer Mother     breast  . Hypertension Mother   . Hyperlipidemia Mother   . Thyroid cancer Mother     had thyroid removed  . Hypertension Father   . Heart attack Maternal Grandfather   . Cancer Other     leukemia; dad's paternal aunt    Exam   System:     General: Well developed & nourished, no acute distress   Skin: Warm & dry, normal coloration and turgor, no rashes   Neurologic: Alert & oriented, normal mood   Cardiovascular: Regular rate & rhythm   Respiratory: Effort & rate normal, LCTAB, acyanotic   Abdomen: Soft, non tender   Extremities: normal strength, tone   Pelvic Exam:    Perineum: Normal perineum   Vulva: Normal, no lesions    Vagina:  Normal mucosa, normal discharge   Cervix: Normal, bulbous, appears closed   Uterus: Normal size/shape/contour for GA   Thin prep pap smear,GC/CHL and HPV FHR: 118 via [place]   Assessment:   Pregnancy: [id number] Patient Active Problem List   Diagnosis Date Noted  . Supervision of other normal pregnancy 10/15/2013  . AMA (advanced maternal age) multigravida 35+ 10/15/2013  . Fatigue 11/25/2012  . Breast pain, left 10/28/2012    [redacted]w[redacted]d [id number] New OB visit     Plan:  Initial labs drawn Continue prenatal vitamins Problem list reviewed and updated Reviewed n/v relief measures and warning s/s to report Reviewed recommended weight gain based on pre-gravid BMI Encouraged well-balanced diet Genetic Screening discussed Integrated Screen: declined Cystic fibrosis screening discussed declined Ultrasound discussed; fetal survey: requested Follow up in 3 weeks for OB visit   Estill Dooms, NP 10/15/2013 3:20 PM

## 2013-10-15 NOTE — Progress Notes (Signed)
U/S-single IUP with +FCA noted, FHR- 118 bpm, cx appears closed, bilateral adnexa appears WNL, CRL c/w 5+5 wks EDD 06/12/2014, +YS noted

## 2013-10-15 NOTE — Patient Instructions (Signed)

## 2013-10-16 LAB — DRUG SCREEN, URINE, NO CONFIRMATION
Amphetamine Screen, Ur: NEGATIVE
Barbiturate Quant, Ur: NEGATIVE
Benzodiazepines.: NEGATIVE
Cocaine Metabolites: NEGATIVE
Creatinine,U: 42 mg/dL
MARIJUANA METABOLITE: NEGATIVE
Methadone: NEGATIVE
OPIATE SCREEN, URINE: NEGATIVE
PHENCYCLIDINE (PCP): NEGATIVE
Propoxyphene: NEGATIVE

## 2013-10-16 LAB — URINALYSIS
BILIRUBIN URINE: NEGATIVE
Glucose, UA: NEGATIVE mg/dL
Hgb urine dipstick: NEGATIVE
Ketones, ur: NEGATIVE mg/dL
LEUKOCYTES UA: NEGATIVE
Nitrite: NEGATIVE
PH: 6 (ref 5.0–8.0)
Protein, ur: NEGATIVE mg/dL
Specific Gravity, Urine: 1.009 (ref 1.005–1.030)
Urobilinogen, UA: 0.2 mg/dL (ref 0.0–1.0)

## 2013-10-16 LAB — HIV ANTIBODY (ROUTINE TESTING W REFLEX): HIV 1&2 Ab, 4th Generation: NONREACTIVE

## 2013-10-16 LAB — OXYCODONE SCREEN, UA, RFLX CONFIRM: Oxycodone Screen, Ur: NEGATIVE ng/mL

## 2013-10-16 LAB — VARICELLA ZOSTER ANTIBODY, IGG: Varicella IgG: 337.7 Index — ABNORMAL HIGH (ref <135.00)

## 2013-10-16 LAB — HEPATITIS B SURFACE ANTIGEN: Hepatitis B Surface Ag: NEGATIVE

## 2013-10-16 LAB — ANTIBODY SCREEN: ANTIBODY SCREEN: NEGATIVE

## 2013-10-16 LAB — RUBELLA SCREEN: Rubella: 2.17 Index — ABNORMAL HIGH (ref <0.90)

## 2013-10-16 LAB — ABO AND RH: Rh Type: POSITIVE

## 2013-10-16 LAB — RPR

## 2013-10-17 LAB — URINE CULTURE
COLONY COUNT: NO GROWTH
Organism ID, Bacteria: NO GROWTH

## 2013-10-19 ENCOUNTER — Encounter: Payer: Self-pay | Admitting: Adult Health

## 2013-10-19 LAB — CYTOLOGY - PAP

## 2013-11-05 ENCOUNTER — Ambulatory Visit (INDEPENDENT_AMBULATORY_CARE_PROVIDER_SITE_OTHER): Payer: 59 | Admitting: Advanced Practice Midwife

## 2013-11-05 VITALS — BP 124/76 | Wt 149.5 lb

## 2013-11-05 DIAGNOSIS — Z1389 Encounter for screening for other disorder: Secondary | ICD-10-CM

## 2013-11-05 DIAGNOSIS — Z331 Pregnant state, incidental: Secondary | ICD-10-CM

## 2013-11-05 DIAGNOSIS — Z3481 Encounter for supervision of other normal pregnancy, first trimester: Secondary | ICD-10-CM

## 2013-11-05 DIAGNOSIS — Z3480 Encounter for supervision of other normal pregnancy, unspecified trimester: Secondary | ICD-10-CM

## 2013-11-05 LAB — POCT URINALYSIS DIPSTICK
GLUCOSE UA: NEGATIVE
Ketones, UA: NEGATIVE
LEUKOCYTES UA: NEGATIVE
Nitrite, UA: NEGATIVE
Protein, UA: NEGATIVE

## 2013-11-05 NOTE — Progress Notes (Signed)
[id number] [redacted]w[redacted]d Estimated Date of Delivery: 06/12/14  Blood pressure 124/76, weight 149 lb 8 oz (67.813 kg), last menstrual period 08/02/2013, not currently breastfeeding.   BP weight and urine results all reviewed and noted.  Please refer to the obstetrical flow sheet for the fundal height and fetal heart rate documentation:  Patient reports good fetal movement, denies any bleeding and no rupture of membranes symptoms or regular contractions. Patient is without complaints. All questions were answered.  Plan:  Continued routine obstetrical care,   Follow up in 4 weeks for OB appointment, declines NT/IT

## 2013-11-16 ENCOUNTER — Telehealth: Payer: Self-pay | Admitting: *Deleted

## 2013-11-16 NOTE — Telephone Encounter (Signed)
Pt calling wanting to know if it was OK to take Prilosec in 1st trimester, advised her to wait until 2nd trimester, try taking Tums if really needs something, avoid spicy and greasy foods and try drinking milk and push fluids.  Pt verbalized understanding.

## 2013-12-03 ENCOUNTER — Encounter: Payer: Self-pay | Admitting: Advanced Practice Midwife

## 2013-12-03 ENCOUNTER — Ambulatory Visit (INDEPENDENT_AMBULATORY_CARE_PROVIDER_SITE_OTHER): Payer: 59 | Admitting: Advanced Practice Midwife

## 2013-12-03 VITALS — BP 116/70 | Wt 153.0 lb

## 2013-12-03 DIAGNOSIS — Z3481 Encounter for supervision of other normal pregnancy, first trimester: Secondary | ICD-10-CM

## 2013-12-03 DIAGNOSIS — Z1389 Encounter for screening for other disorder: Secondary | ICD-10-CM

## 2013-12-03 DIAGNOSIS — Z3480 Encounter for supervision of other normal pregnancy, unspecified trimester: Secondary | ICD-10-CM

## 2013-12-03 DIAGNOSIS — Z331 Pregnant state, incidental: Secondary | ICD-10-CM

## 2013-12-03 LAB — POCT URINALYSIS DIPSTICK
Blood, UA: NEGATIVE
Glucose, UA: NEGATIVE
KETONES UA: NEGATIVE
LEUKOCYTES UA: NEGATIVE
NITRITE UA: NEGATIVE
PROTEIN UA: NEGATIVE

## 2013-12-03 NOTE — Progress Notes (Signed)
Pt states that she keeps getting headaches, tylenol helps but still having them.

## 2013-12-03 NOTE — Progress Notes (Signed)
[id number] [redacted]w[redacted]d Estimated Date of Delivery: 06/12/14  Blood pressure 116/70, weight 153 lb (69.4 kg), last menstrual period 08/02/2013, not currently breastfeeding.   BP weight and urine results all reviewed and noted.  Please refer to the obstetrical flow sheet for the fundal height and fetal heart rate documentation:  Patient denies any bleeding or cramps. Patient c/o HA, relieved by tylenol All questions were answered.  Plan:  Continued routine obstetrical care, If HA become resistant to Tylenol, consider consult with Monna Fam, NP  Follow up in 4 weeks for OB appointment,

## 2013-12-21 ENCOUNTER — Telehealth: Payer: Self-pay | Admitting: *Deleted

## 2013-12-21 NOTE — Telephone Encounter (Signed)
Spoke with pt. Pt was wondering if she could fly the first of November. I spoke with Maudie Mercury, CNM and she advised as long as she is not having any preterm labor symptoms, and as long as it's ok with the airline, she should be fine. Advised it's hard to say for sure since it's several weeks down the road. Pt voiced understanding. [place]

## 2013-12-31 ENCOUNTER — Ambulatory Visit (INDEPENDENT_AMBULATORY_CARE_PROVIDER_SITE_OTHER): Payer: 59 | Admitting: Advanced Practice Midwife

## 2013-12-31 VITALS — BP 130/70 | Wt 160.5 lb

## 2013-12-31 DIAGNOSIS — Z1389 Encounter for screening for other disorder: Secondary | ICD-10-CM

## 2013-12-31 DIAGNOSIS — Z331 Pregnant state, incidental: Secondary | ICD-10-CM

## 2013-12-31 DIAGNOSIS — Z3482 Encounter for supervision of other normal pregnancy, second trimester: Secondary | ICD-10-CM

## 2013-12-31 DIAGNOSIS — Z3480 Encounter for supervision of other normal pregnancy, unspecified trimester: Secondary | ICD-10-CM

## 2013-12-31 LAB — POCT URINALYSIS DIPSTICK
Blood, UA: NEGATIVE
Glucose, UA: NEGATIVE
Ketones, UA: NEGATIVE
Nitrite, UA: NEGATIVE
PROTEIN UA: NEGATIVE

## 2013-12-31 NOTE — Progress Notes (Signed)
[id number] [redacted]w[redacted]d Estimated Date of Delivery: 06/12/14  Last menstrual period 08/02/2013, not currently breastfeeding.   BP weight and urine results all reviewed and noted.  Please refer to the obstetrical flow sheet for the fundal height and fetal heart rate documentation:  Patient denies any bleeding and no rupture of membranes symptoms or regular contractions. Patient is without complaints. Started prilosec for heartburn and that is helping.  HA's are much better All questions were answered.  Plan:  Continued routine obstetrical care,   Follow up in 3 weeks for OB appointment, anatomy scan

## 2014-01-20 ENCOUNTER — Other Ambulatory Visit: Payer: Self-pay | Admitting: *Deleted

## 2014-01-20 ENCOUNTER — Telehealth: Payer: Self-pay | Admitting: Family Medicine

## 2014-01-20 MED ORDER — ALBUTEROL SULFATE HFA 108 (90 BASE) MCG/ACT IN AERS: 2.0000 | INHALATION_SPRAY | Freq: Four times a day (QID) | RESPIRATORY_TRACT | Status: DC | PRN

## 2014-01-20 NOTE — Telephone Encounter (Signed)
Med is not on pt's med list or history. i called pt she has not gotten script in a couple of years she said. She states she mostly uses while she is pregnant and for allergies. She does not have asthma.

## 2014-01-20 NOTE — Telephone Encounter (Signed)
Med sent to pharm. Pt notified.  

## 2014-01-20 NOTE — Telephone Encounter (Signed)
Ok plus two ref 

## 2014-01-20 NOTE — Telephone Encounter (Signed)
Last seen 08/13/13 for cough

## 2014-01-20 NOTE — Telephone Encounter (Signed)
Patient needs refill on her Ventolin HFA inhaler to [facility].

## 2014-01-22 ENCOUNTER — Other Ambulatory Visit: Payer: 59

## 2014-01-26 ENCOUNTER — Ambulatory Visit (INDEPENDENT_AMBULATORY_CARE_PROVIDER_SITE_OTHER): Payer: 59 | Admitting: Women's Health

## 2014-01-26 ENCOUNTER — Encounter: Payer: Self-pay | Admitting: Women's Health

## 2014-01-26 ENCOUNTER — Other Ambulatory Visit: Payer: Self-pay | Admitting: Advanced Practice Midwife

## 2014-01-26 ENCOUNTER — Ambulatory Visit (INDEPENDENT_AMBULATORY_CARE_PROVIDER_SITE_OTHER): Payer: 59

## 2014-01-26 VITALS — BP 122/70 | Wt 164.0 lb

## 2014-01-26 DIAGNOSIS — O09523 Supervision of elderly multigravida, third trimester: Secondary | ICD-10-CM

## 2014-01-26 DIAGNOSIS — O09292 Supervision of pregnancy with other poor reproductive or obstetric history, second trimester: Secondary | ICD-10-CM

## 2014-01-26 DIAGNOSIS — Z1389 Encounter for screening for other disorder: Secondary | ICD-10-CM

## 2014-01-26 DIAGNOSIS — Z3482 Encounter for supervision of other normal pregnancy, second trimester: Secondary | ICD-10-CM

## 2014-01-26 DIAGNOSIS — Z331 Pregnant state, incidental: Secondary | ICD-10-CM

## 2014-01-26 DIAGNOSIS — Z23 Encounter for immunization: Secondary | ICD-10-CM

## 2014-01-26 DIAGNOSIS — O09522 Supervision of elderly multigravida, second trimester: Secondary | ICD-10-CM

## 2014-01-26 LAB — POCT URINALYSIS DIPSTICK
Blood, UA: NEGATIVE
Glucose, UA: NEGATIVE
Ketones, UA: NEGATIVE
Nitrite, UA: NEGATIVE
Protein, UA: NEGATIVE

## 2014-01-26 NOTE — Progress Notes (Signed)
Low-risk OB appointment [id number] [redacted]w[redacted]d Estimated Date of Delivery: 06/12/14 BP 122/70  Wt 164 lb (74.39 kg)  LMP 08/02/2013  BP, weight, and urine reviewed.  Refer to obstetrical flow sheet for FH & FHR.  Reports good fm.  Denies regular uc's, lof, vb, or uti s/s. No complaints. Reviewed today's normal anatomy u/s, ptl s/s. Plan:  Continue routine obstetrical care  F/U in 4wks for OB appointment  Flu shot today

## 2014-01-26 NOTE — Progress Notes (Signed)
U/S(20+3wks)-active fetus, meas c/w dates, fluid WNL, posterior Gr 0 placenta, cx appears closed (4.6cm), FHR-138 bpm, female fetus, no major abnl noted

## 2014-02-08 ENCOUNTER — Encounter: Payer: Self-pay | Admitting: Women's Health

## 2014-02-25 ENCOUNTER — Ambulatory Visit (INDEPENDENT_AMBULATORY_CARE_PROVIDER_SITE_OTHER): Payer: 59 | Admitting: Advanced Practice Midwife

## 2014-02-25 ENCOUNTER — Encounter: Payer: Self-pay | Admitting: Advanced Practice Midwife

## 2014-02-25 VITALS — BP 124/70 | Wt 169.0 lb

## 2014-02-25 DIAGNOSIS — Z1389 Encounter for screening for other disorder: Secondary | ICD-10-CM

## 2014-02-25 DIAGNOSIS — Z3482 Encounter for supervision of other normal pregnancy, second trimester: Secondary | ICD-10-CM

## 2014-02-25 DIAGNOSIS — N760 Acute vaginitis: Secondary | ICD-10-CM

## 2014-02-25 DIAGNOSIS — Z331 Pregnant state, incidental: Secondary | ICD-10-CM

## 2014-02-25 DIAGNOSIS — B9689 Other specified bacterial agents as the cause of diseases classified elsewhere: Secondary | ICD-10-CM

## 2014-02-25 LAB — POCT URINALYSIS DIPSTICK
GLUCOSE UA: NEGATIVE
Ketones, UA: NEGATIVE
Leukocytes, UA: NEGATIVE
NITRITE UA: NEGATIVE
Protein, UA: NEGATIVE
RBC UA: NEGATIVE

## 2014-02-25 MED ORDER — METRONIDAZOLE 500 MG PO TABS: 500.0000 mg | ORAL_TABLET | Freq: Two times a day (BID) | ORAL | Status: DC

## 2014-02-25 NOTE — Progress Notes (Signed)
[id number] [redacted]w[redacted]d Estimated Date of Delivery: 06/12/14  Blood pressure 124/70, weight 169 lb (76.658 kg), last menstrual period 08/02/2013.   BP weight and urine results all reviewed and noted.  Please refer to the obstetrical flow sheet for the fundal height and fetal heart rate documentation:  Patient reports good fetal movement, denies any bleeding and no rupture of membranes symptoms or regular contractions. Patient C/O vaginal irritaion.  SSE: thin white d/c.  Wet prep + BV All questions were answered.  Plan:  Continued routine obstetrical care, Flagyl for BV  Follow up in 4 weeks for OB appointment, PN2

## 2014-02-25 NOTE — Patient Instructions (Signed)
1. Before your test, do not eat or drink anything for 8-10 hours prior to your  appointment (a small amount of water is allowed and you may take any medicines you normally take). Be sure to drink lots of water the day before. 2. When you arrive, your blood will be drawn for a 'fasting' blood sugar level.  Then you will be given a sweetened carbonated beverage to drink. You should  complete drinking this beverage within five minutes. After finishing the  beverage, you will have your blood drawn exactly 1 and 2 hours later. Having  your blood drawn on time is an important part of this test. A total of three blood  samples will be done. 3. The test takes approximately 2  hours. During the test, do not have anything to  eat or drink. Do not smoke, chew gum (not even sugarless gum) or use breath mints.  4. During the test you should remain close by and seated as much as possible and  avoid walking around. You may want to bring a book or something else to  occupy your time.  5. After your test, you may eat and drink as normal. You may want to bring a snack  to eat after the test is finished. Your provider will advise you as to the results of  this test and any follow-up if necessary  You will also be retested for syphilis, HIV and blood levels (anemia):  You were already tested in the first trimester, but [place] recommends retesting.  Additionally, you will be tested for Type 2 Herpes. MOST people do not know that they have genital herpes, as only around 15% of people have outbreaks.  However, it is still transmittable to other people, including the baby (but only during the birth).  If you test positive for Type 2 Herpes, we place you on a medicine called acyclovir the last 6 weeks of your pregnancy to prevent transmission of the virus to the baby during the birth.    If your sugar test is positive for gestational diabetes, you will be given an phone call and further instructions discussed.   We typically do not call patients with positive herpes results, but will discuss it at your next appointment.  If you wish to know all of your test results before your next appointment, feel free to call the office, or look up your test results on Mychart.  (The range that the lab uses for normal values of the sugar test are not necessarily the range that is used for pregnant women; if your results are within the range, they are definitely normal.  However, if a value is deemed "high" by the lab, it may not be too high for a pregnant woman.  We will need to discuss the normal range if your value(s) fall in the "high" category).     Sometime between 27 and 36 weeks, it is recommended that you and anyone who is going to be in close contact with your baby receive the Tdap booster.  You should receive it EACH pregnancy, regardless of when your last booster was.  You may go to the Health Department (no appointment necessary) or your Primary Care office to receive the vaccine.  If you do not receive the vaccine prior to delivery, it will be offered in the hospital.  However, if you get it at least 2 weeks prior to delivery, you will have the added advantage of passing the immunity to your baby.

## 2014-02-25 NOTE — Progress Notes (Signed)
Pt states that she thinks she may have a yeast infection. Pt states that she has some irritation.

## 2014-02-26 ENCOUNTER — Ambulatory Visit (INDEPENDENT_AMBULATORY_CARE_PROVIDER_SITE_OTHER): Payer: 59 | Admitting: Family Medicine

## 2014-02-26 ENCOUNTER — Encounter: Payer: Self-pay | Admitting: Family Medicine

## 2014-02-26 VITALS — BP 126/80 | Temp 98.9°F | Ht 63.0 in | Wt 169.0 lb

## 2014-02-26 DIAGNOSIS — J4520 Mild intermittent asthma, uncomplicated: Secondary | ICD-10-CM

## 2014-02-26 DIAGNOSIS — J683 Other acute and subacute respiratory conditions due to chemicals, gases, fumes and vapors: Secondary | ICD-10-CM

## 2014-02-26 MED ORDER — PREDNISONE 10 MG PO TABS: ORAL_TABLET | ORAL | Status: DC

## 2014-02-26 MED ORDER — AZITHROMYCIN 250 MG PO TABS: ORAL_TABLET | ORAL | Status: DC

## 2014-02-26 NOTE — Progress Notes (Signed)
   Subjective:    Patient ID: Bailey Sullivan, female    DOB: 05/23/1980, 33 y.o.   MRN: [id number]  Cough This is a new problem. Episode onset: Last Saturday. The problem has been gradually worsening. The cough is non-productive. Associated symptoms include headaches, rhinorrhea and wheezing. She has tried OTC cough suppressant for the symptoms. The treatment provided mild relief.   Patient has history of reactive airways. More more cough and wheezing last few days. Day and night. Pharynx feeling short of breath at times. Using albuterol fairly regularly.  No high fevers cough is productive.   Patient is 6 months pregnant.   Review of Systems  HENT: Positive for rhinorrhea.   Respiratory: Positive for cough and wheezing.   Neurological: Positive for headaches.       Objective:   Physical Exam  Alert mild malaise HET moderate his congestion pharynx normal neck supple lungs bilateral significant wheezes no tachypnea intermittent wheezy cough during exam heart mild tachycardia      Assessment & Plan:  Impression bronchitis with exacerbation of reactive airways plan prednisone taper. Albuterol when necessary. Z-Pak. Warning signs discussed. WSL

## 2014-03-25 ENCOUNTER — Ambulatory Visit (INDEPENDENT_AMBULATORY_CARE_PROVIDER_SITE_OTHER): Payer: 59 | Admitting: Advanced Practice Midwife

## 2014-03-25 ENCOUNTER — Other Ambulatory Visit: Payer: 59

## 2014-03-25 ENCOUNTER — Encounter: Payer: Self-pay | Admitting: Advanced Practice Midwife

## 2014-03-25 VITALS — BP 110/70 | Wt 170.0 lb

## 2014-03-25 DIAGNOSIS — Z1389 Encounter for screening for other disorder: Secondary | ICD-10-CM

## 2014-03-25 DIAGNOSIS — Z131 Encounter for screening for diabetes mellitus: Secondary | ICD-10-CM

## 2014-03-25 DIAGNOSIS — Z113 Encounter for screening for infections with a predominantly sexual mode of transmission: Secondary | ICD-10-CM

## 2014-03-25 DIAGNOSIS — Z3482 Encounter for supervision of other normal pregnancy, second trimester: Secondary | ICD-10-CM

## 2014-03-25 DIAGNOSIS — Z114 Encounter for screening for human immunodeficiency virus [HIV]: Secondary | ICD-10-CM

## 2014-03-25 DIAGNOSIS — Z0184 Encounter for antibody response examination: Secondary | ICD-10-CM

## 2014-03-25 DIAGNOSIS — Z3483 Encounter for supervision of other normal pregnancy, third trimester: Secondary | ICD-10-CM

## 2014-03-25 DIAGNOSIS — Z331 Pregnant state, incidental: Secondary | ICD-10-CM

## 2014-03-25 LAB — POCT URINALYSIS DIPSTICK
GLUCOSE UA: NEGATIVE
KETONES UA: NEGATIVE
Nitrite, UA: NEGATIVE
RBC UA: NEGATIVE

## 2014-03-25 LAB — CBC
HCT: 33.8 % — ABNORMAL LOW (ref 36.0–46.0)
Hemoglobin: 10.7 g/dL — ABNORMAL LOW (ref 12.0–15.0)
MCH: 26.2 pg (ref 26.0–34.0)
MCHC: 31.7 g/dL (ref 30.0–36.0)
MCV: 82.6 fL (ref 78.0–100.0)
MPV: 9.8 fL (ref 9.4–12.4)
PLATELETS: 404 10*3/uL — AB (ref 150–400)
RBC: 4.09 MIL/uL (ref 3.87–5.11)
RDW: 13.9 % (ref 11.5–15.5)
WBC: 10.8 10*3/uL — AB (ref 4.0–10.5)

## 2014-03-25 MED ORDER — METRONIDAZOLE 500 MG PO TABS: 500.0000 mg | ORAL_TABLET | Freq: Two times a day (BID) | ORAL | Status: DC

## 2014-03-25 NOTE — Progress Notes (Signed)
[id number] [redacted]w[redacted]d Estimated Date of Delivery: 06/12/14  Blood pressure 110/70, weight 170 lb (77.111 kg), last menstrual period 08/02/2013.   BP weight and urine results all reviewed and noted.  Please refer to the obstetrical flow sheet for the fundal height and fetal heart rate documentation:  Patient reports good fetal movement, denies any bleeding and no rupture of membranes symptoms or regular contractions. Was treated for BV last month.  Sx went away, but now they are returning.   All questions were answered.  Plan:  Another round of flagyl, then start probiotic. Continued routine obstetrical care, PN2 today  Follow up in 3 weeks for OB appointment,

## 2014-03-26 LAB — ANTIBODY SCREEN: ANTIBODY SCREEN: NEGATIVE

## 2014-03-26 LAB — GLUCOSE TOLERANCE, 2 HOURS W/ 1HR
Glucose, 1 hour: 144 mg/dL (ref 70–170)
Glucose, 2 hour: 95 mg/dL (ref 70–139)
Glucose, Fasting: 70 mg/dL (ref 70–99)

## 2014-03-26 LAB — HIV ANTIBODY (ROUTINE TESTING W REFLEX): HIV 1&2 Ab, 4th Generation: NONREACTIVE

## 2014-03-26 LAB — RPR

## 2014-03-29 LAB — HSV 2 ANTIBODY, IGG

## 2014-04-09 NOTE — L&D Delivery Note (Signed)
Patient is 34 y.o. [id number] [redacted]w[redacted]d admitted in active labor for TOL, hx of prev c/s followed by 2 VBAC, gHTN  Delivery Note At 9:07 AM a viable female was delivered via VBAC, Spontaneous (Presentation: ;  ).  APGAR: 9, 9; weight 7 lb 3.2 oz (3265 g).   Placenta status: Intact, Spontaneous.  Cord: 3 vessels with the following complications: None.  Anesthesia: Epidural  Episiotomy: None Lacerations: 2nd degree;Sulcus Suture Repair: 3.0 vicryl rapide Est. Blood Loss (mL):  340mL  Mom to postpartum.  Baby to Couplet care / Skin to Skin.  Bailey Sullivan ROCIO 05/30/2014, 9:37 AM

## 2014-04-20 ENCOUNTER — Ambulatory Visit (INDEPENDENT_AMBULATORY_CARE_PROVIDER_SITE_OTHER): Payer: 59 | Admitting: Advanced Practice Midwife

## 2014-04-20 VITALS — BP 132/86 | Wt 175.0 lb

## 2014-04-20 DIAGNOSIS — Z331 Pregnant state, incidental: Secondary | ICD-10-CM

## 2014-04-20 DIAGNOSIS — Z1389 Encounter for screening for other disorder: Secondary | ICD-10-CM

## 2014-04-20 DIAGNOSIS — Z3483 Encounter for supervision of other normal pregnancy, third trimester: Secondary | ICD-10-CM

## 2014-04-20 LAB — POCT URINALYSIS DIPSTICK
Blood, UA: NEGATIVE
GLUCOSE UA: NEGATIVE
Ketones, UA: NEGATIVE
NITRITE UA: NEGATIVE
Protein, UA: NEGATIVE

## 2014-04-20 NOTE — Progress Notes (Signed)
G5P3013 [redacted]w[redacted]d Estimated Date of Delivery: 06/12/14  Blood pressure 132/86, weight 175 lb (79.379 kg), last menstrual period 08/02/2013.   BP weight and urine results all reviewed and noted.  Please refer to the obstetrical flow sheet for the fundal height and fetal heart rate documentation:  Patient reports good fetal movement, denies any bleeding and no rupture of membranes symptoms or regular contractions. Patient is without complaints. All questions were answered.  Plan:  Continued routine obstetrical care,   Follow up in 2 weeks for OB appointment,

## 2014-05-04 ENCOUNTER — Ambulatory Visit (INDEPENDENT_AMBULATORY_CARE_PROVIDER_SITE_OTHER): Payer: 59 | Admitting: Women's Health

## 2014-05-04 ENCOUNTER — Encounter: Payer: Self-pay | Admitting: Women's Health

## 2014-05-04 VITALS — BP 140/68 | Wt 175.0 lb

## 2014-05-04 DIAGNOSIS — Z331 Pregnant state, incidental: Secondary | ICD-10-CM

## 2014-05-04 DIAGNOSIS — O09523 Supervision of elderly multigravida, third trimester: Secondary | ICD-10-CM

## 2014-05-04 DIAGNOSIS — Z3483 Encounter for supervision of other normal pregnancy, third trimester: Secondary | ICD-10-CM

## 2014-05-04 DIAGNOSIS — Z1389 Encounter for screening for other disorder: Secondary | ICD-10-CM

## 2014-05-04 LAB — POCT URINALYSIS DIPSTICK
Blood, UA: NEGATIVE
Glucose, UA: NEGATIVE
Ketones, UA: NEGATIVE
Nitrite, UA: NEGATIVE
PROTEIN UA: NEGATIVE

## 2014-05-04 NOTE — Progress Notes (Signed)
Low-risk OB appointment [id number] [redacted]w[redacted]d Estimated Date of Delivery: 06/12/14 LMP 08/02/2013  BP, weight, and urine reviewed.  Refer to obstetrical flow sheet for FH & FHR.  Reports good fm.  Denies regular uc's, lof, vb, or uti s/s. LBP, discussed relief measures.  Denies ha, scotomata, ruq/epigastric pain, n/v.  No proteinuria. Had labile bp's w/ 1st baby.  Reviewed ptl s/s, fkc, pre-e s/s. Plan:  Continue routine obstetrical care, get bpp/afi/efw/dopp for AMA F/U in 2d for OB appointment/bp check, and u/s

## 2014-05-04 NOTE — Patient Instructions (Addendum)
Call the office [phone]) or go to [facility] if:  You begin to have strong, frequent contractions  Your water breaks.  Sometimes it is a big gush of fluid, sometimes it is just a trickle that keeps getting your panties wet or running down your legs  You have vaginal bleeding.  It is normal to have a small amount of spotting if your cervix was checked.   You don't feel your baby moving like normal.  If you don't, get you something to eat and drink and lay down and focus on feeling your baby move.  You should feel at least 10 movements in 2 hours.  If you don't, you should call the office or go to [place] your lower back pain you may:  Purchase a pregnancy belt from [employer] [place], [employer], [employer], etc and wear it while you are up and about  Take warm baths  Use a heating pad to your lower back for no longer than 20 minutes at a time, and do not place near abdomen  Take tylenol as needed. Please follow directions on the bottle   Preterm Labor Information Preterm labor is when labor starts at less than 37 weeks of pregnancy. The normal length of a pregnancy is 39 to 41 weeks. CAUSES Often, there is no identifiable underlying cause as to why a woman goes into preterm labor. One of the most common known causes of preterm labor is infection. Infections of the uterus, cervix, vagina, amniotic sac, bladder, kidney, or even the lungs (pneumonia) can cause labor to start. Other suspected causes of preterm labor include:   Urogenital infections, such as yeast infections and bacterial vaginosis.   Uterine abnormalities (uterine shape, uterine septum, fibroids, or bleeding from the placenta).   A cervix that has been operated on (it may fail to stay closed).   Malformations in the fetus.   Multiple gestations (twins, triplets, and so on).   Breakage of the amniotic sac.  RISK FACTORS  Having a previous history of preterm labor.   Having premature  rupture of membranes (PROM).   Having a placenta that covers the opening of the cervix (placenta previa).   Having a placenta that separates from the uterus (placental abruption).   Having a cervix that is too weak to hold the fetus in the uterus (incompetent cervix).   Having too much fluid in the amniotic sac (polyhydramnios).   Taking illegal drugs or smoking while pregnant.   Not gaining enough weight while pregnant.   Being younger than 49 and older than 36 years old.   Having a low socioeconomic status.   Being African American. SYMPTOMS Signs and symptoms of preterm labor include:   Menstrual-like cramps, abdominal pain, or back pain.  Uterine contractions that are regular, as frequent as six in an hour, regardless of their intensity (may be mild or painful).  Contractions that start on the top of the uterus and spread down to the lower abdomen and back.   A sense of increased pelvic pressure.   A watery or bloody mucus discharge that comes from the vagina.  TREATMENT Depending on the length of the pregnancy and other circumstances, your health care provider may suggest bed rest. If necessary, there are medicines that can be given to stop contractions and to mature the fetal lungs. If labor happens before 34 weeks of pregnancy, a prolonged hospital stay may be recommended. Treatment depends on the condition of both you and  the fetus.  WHAT SHOULD YOU DO IF YOU THINK YOU ARE IN PRETERM LABOR? Call your health care provider right away. You will need to go to the hospital to get checked immediately. HOW CAN YOU PREVENT PRETERM LABOR IN FUTURE PREGNANCIES? You should:   Stop smoking if you smoke.  Maintain healthy weight gain and avoid chemicals and drugs that are not necessary.  Be watchful for any type of infection.  Inform your health care provider if you have a known history of preterm labor. Document Released: 06/16/2003 Document Revised: 11/26/2012  Document Reviewed: 04/28/2012 [facility] Patient Information 2015 [place], [place]. This information is not intended to replace advice given to you by your health care provider. Make sure you discuss any questions you have with your health care provider.

## 2014-05-06 ENCOUNTER — Other Ambulatory Visit: Payer: Self-pay | Admitting: Women's Health

## 2014-05-06 ENCOUNTER — Ambulatory Visit (INDEPENDENT_AMBULATORY_CARE_PROVIDER_SITE_OTHER): Payer: 59 | Admitting: Advanced Practice Midwife

## 2014-05-06 ENCOUNTER — Encounter: Payer: Self-pay | Admitting: Advanced Practice Midwife

## 2014-05-06 ENCOUNTER — Ambulatory Visit (INDEPENDENT_AMBULATORY_CARE_PROVIDER_SITE_OTHER): Payer: 59

## 2014-05-06 VITALS — BP 118/60 | Wt 173.5 lb

## 2014-05-06 DIAGNOSIS — O09523 Supervision of elderly multigravida, third trimester: Secondary | ICD-10-CM

## 2014-05-06 DIAGNOSIS — Z331 Pregnant state, incidental: Secondary | ICD-10-CM

## 2014-05-06 DIAGNOSIS — Z1389 Encounter for screening for other disorder: Secondary | ICD-10-CM

## 2014-05-06 LAB — POCT URINALYSIS DIPSTICK
Glucose, UA: NEGATIVE
Ketones, UA: NEGATIVE
Leukocytes, UA: NEGATIVE
Nitrite, UA: NEGATIVE
PROTEIN UA: NEGATIVE
RBC UA: NEGATIVE

## 2014-05-06 NOTE — Progress Notes (Signed)
U/S(34+5wks)-transverse presentation (fetal head on maternal Lt side), FHR- 162 bpm, EFw 5 lb 8 oz (44th%tile), fluid WNL AFI-6.6cm SDP-3.9cm, Gr 2 placenta, BPP 8/8, female fetus

## 2014-05-06 NOTE — Progress Notes (Signed)
Pt denies any problems or concerns at this time.  

## 2014-05-06 NOTE — Progress Notes (Signed)
[id number] [redacted]w[redacted]d Estimated Date of Delivery: 06/12/14  Blood pressure 118/60, weight 173 lb 8 oz (78.699 kg), last menstrual period 08/02/2013.   BP weight and urine results all reviewed and noted.  Please refer to the obstetrical flow sheet for the fundal height and fetal heart rate documentation: [place] today:  U/S(34+5wks)-transverse presentation (fetal head on maternal Lt side), FHR- 162 bpm, EFw 5 lb 8 oz (44th%tile), fluid WNL AFI-6.6cm SDP-3.9cm, Gr 2 placenta, BPP 8/8, female fetus  Patient reports good fetal movement, denies any bleeding and no rupture of membranes symptoms or regular contractions. Patient is without complaints. All questions were answered.  Plan:  Continued routine obstetrical care, Discussed strategies to turn baby (swimming pool, pelvic tilt, etc)  Follow up in 2 weeks for OB appointment, Check posititon with beside [place].  Further antenatal surveilence not necessary (<40)

## 2014-05-09 LAB — US OB FOLLOW UP

## 2014-05-19 ENCOUNTER — Encounter: Payer: Self-pay | Admitting: Advanced Practice Midwife

## 2014-05-19 ENCOUNTER — Ambulatory Visit (INDEPENDENT_AMBULATORY_CARE_PROVIDER_SITE_OTHER): Payer: 59 | Admitting: Advanced Practice Midwife

## 2014-05-19 VITALS — BP 138/88 | Wt 176.0 lb

## 2014-05-19 DIAGNOSIS — Z1389 Encounter for screening for other disorder: Secondary | ICD-10-CM

## 2014-05-19 DIAGNOSIS — Z331 Pregnant state, incidental: Secondary | ICD-10-CM

## 2014-05-19 DIAGNOSIS — Z3483 Encounter for supervision of other normal pregnancy, third trimester: Secondary | ICD-10-CM

## 2014-05-19 DIAGNOSIS — Z3685 Encounter for antenatal screening for Streptococcus B: Secondary | ICD-10-CM

## 2014-05-19 DIAGNOSIS — Z118 Encounter for screening for other infectious and parasitic diseases: Secondary | ICD-10-CM

## 2014-05-19 DIAGNOSIS — Z1159 Encounter for screening for other viral diseases: Secondary | ICD-10-CM

## 2014-05-19 LAB — POCT URINALYSIS DIPSTICK
Blood, UA: NEGATIVE
Glucose, UA: NEGATIVE
Ketones, UA: NEGATIVE
Nitrite, UA: NEGATIVE
Protein, UA: NEGATIVE

## 2014-05-19 LAB — OB RESULTS CONSOLE GC/CHLAMYDIA
Chlamydia: NEGATIVE
GC PROBE AMP, GENITAL: NEGATIVE

## 2014-05-19 LAB — OB RESULTS CONSOLE GBS: GBS: POSITIVE

## 2014-05-19 NOTE — Progress Notes (Signed)
[id number] [redacted]w[redacted]d Estimated Date of Delivery: 06/12/14  Blood pressure 140/90, weight 176 lb (79.833 kg), last menstrual period 08/02/2013.   BP weight and urine results all reviewed and noted.  Please refer to the obstetrical flow sheet for the fundal height and fetal heart rate documentation: Vtx confirmed with [place].  GBS collected  Patient reports good fetal movement, denies any bleeding and no rupture of membranes symptoms or regular contractions. Patient is without complaints. Denies HA, RUQ pain, vision changes. All questions were answered.  Plan:  Continued obstetrical care, F/U Friday for BP check

## 2014-05-19 NOTE — Patient Instructions (Signed)

## 2014-05-21 ENCOUNTER — Encounter: Payer: Self-pay | Admitting: Obstetrics & Gynecology

## 2014-05-21 ENCOUNTER — Ambulatory Visit (INDEPENDENT_AMBULATORY_CARE_PROVIDER_SITE_OTHER): Payer: 59 | Admitting: Obstetrics & Gynecology

## 2014-05-21 VITALS — BP 150/100 | Wt 178.0 lb

## 2014-05-21 DIAGNOSIS — Z331 Pregnant state, incidental: Secondary | ICD-10-CM

## 2014-05-21 DIAGNOSIS — O133 Gestational [pregnancy-induced] hypertension without significant proteinuria, third trimester: Secondary | ICD-10-CM

## 2014-05-21 DIAGNOSIS — Z1389 Encounter for screening for other disorder: Secondary | ICD-10-CM

## 2014-05-21 LAB — POCT URINALYSIS DIPSTICK
Blood, UA: NEGATIVE
Glucose, UA: NEGATIVE
KETONES UA: NEGATIVE
NITRITE UA: NEGATIVE
Protein, UA: NEGATIVE

## 2014-05-21 MED ORDER — LABETALOL HCL 100 MG PO TABS: 100.0000 mg | ORAL_TABLET | Freq: Two times a day (BID) | ORAL | Status: DC

## 2014-05-21 NOTE — Progress Notes (Signed)
BP still up, to the point I am going to tret with Labetalol 100 BID and see in 3 days for NST and BP recheck

## 2014-05-22 LAB — GC/CHLAMYDIA PROBE AMP
CHLAMYDIA, DNA PROBE: NEGATIVE
Neisseria gonorrhoeae by PCR: NEGATIVE

## 2014-05-24 ENCOUNTER — Other Ambulatory Visit: Payer: 59 | Admitting: Obstetrics & Gynecology

## 2014-05-25 LAB — STREP GP B SUSCEPTIBILITY

## 2014-05-25 LAB — STREP GP B NAA+RFLX: Strep Gp B NAA+Rflx: POSITIVE — AB

## 2014-05-26 ENCOUNTER — Other Ambulatory Visit: Payer: 59 | Admitting: Obstetrics & Gynecology

## 2014-05-28 ENCOUNTER — Other Ambulatory Visit: Payer: 59 | Admitting: Obstetrics & Gynecology

## 2014-05-28 ENCOUNTER — Encounter: Payer: Self-pay | Admitting: Obstetrics & Gynecology

## 2014-05-28 ENCOUNTER — Ambulatory Visit (INDEPENDENT_AMBULATORY_CARE_PROVIDER_SITE_OTHER): Payer: 59 | Admitting: Obstetrics & Gynecology

## 2014-05-28 VITALS — BP 148/80 | Wt 173.0 lb

## 2014-05-28 DIAGNOSIS — O09523 Supervision of elderly multigravida, third trimester: Secondary | ICD-10-CM

## 2014-05-28 DIAGNOSIS — O3421 Maternal care for scar from previous cesarean delivery: Secondary | ICD-10-CM

## 2014-05-28 DIAGNOSIS — O0993 Supervision of high risk pregnancy, unspecified, third trimester: Secondary | ICD-10-CM

## 2014-05-28 DIAGNOSIS — Z1389 Encounter for screening for other disorder: Secondary | ICD-10-CM

## 2014-05-28 DIAGNOSIS — Z331 Pregnant state, incidental: Secondary | ICD-10-CM

## 2014-05-28 DIAGNOSIS — O133 Gestational [pregnancy-induced] hypertension without significant proteinuria, third trimester: Secondary | ICD-10-CM | POA: Diagnosis not present

## 2014-05-28 LAB — POCT URINALYSIS DIPSTICK
Glucose, UA: NEGATIVE
Ketones, UA: NEGATIVE
Nitrite, UA: NEGATIVE
PROTEIN UA: NEGATIVE
RBC UA: NEGATIVE

## 2014-05-28 NOTE — Progress Notes (Signed)
Fetal Surveillance Testing today:  Reactive NST   High Risk Pregnancy Diagnosis(es):   Gestational Hypertension  G5P3013 [redacted]w[redacted]d Estimated Date of Delivery: 06/12/14  Blood pressure 148/80, weight 173 lb (78.472 kg), last menstrual period 08/02/2013.  Urinalysis: Negative   HPI: The patient is being seen today for ongoing management of gestational hypertension. Today she reports no acute issues   BP weight and urine results all reviewed and noted. Patient reports good fetal movement, denies any bleeding and no rupture of membranes symptoms or regular contractions.  Fundal Height:  38 Fetal Heart rate:  145 Edema:  none  Patient is without complaints other than noted in her HPI. All questions were answered.  All lab and sonogram results have been reviewed. Comments:   Assessment:  1.  Pregnancy at [redacted]w[redacted]d,  Estimated Date of Delivery: 06/12/14 :  Pregnancy issues stable                          2.  Gestational hypertension:  BP elevated, will increase labetalol to 200 BID                        3.    Medication(s) Plans:  Increase labetalol to 200 mg BID  Treatment Plan:  Induction scheduled for next Saturday, sonogram in 4 days  Follow up in Tuesday weeks for appointment for high risk OB care, sonogram

## 2014-05-29 ENCOUNTER — Inpatient Hospital Stay (HOSPITAL_COMMUNITY)
Admission: AD | Admit: 2014-05-29 | Discharge: 2014-05-31 | DRG: 775 | Disposition: A | Discharge status: Home / Self Care | Payer: 59 | Source: Ambulatory Visit | Attending: Obstetrics and Gynecology | Admitting: Obstetrics and Gynecology

## 2014-05-29 ENCOUNTER — Encounter (HOSPITAL_COMMUNITY): Payer: Self-pay

## 2014-05-29 DIAGNOSIS — O09523 Supervision of elderly multigravida, third trimester: Secondary | ICD-10-CM | POA: Diagnosis not present

## 2014-05-29 DIAGNOSIS — Z3483 Encounter for supervision of other normal pregnancy, third trimester: Secondary | ICD-10-CM

## 2014-05-29 DIAGNOSIS — O99824 Streptococcus B carrier state complicating childbirth: Secondary | ICD-10-CM | POA: Diagnosis not present

## 2014-05-29 DIAGNOSIS — Z8249 Family history of ischemic heart disease and other diseases of the circulatory system: Secondary | ICD-10-CM

## 2014-05-29 DIAGNOSIS — Z3A38 38 weeks gestation of pregnancy: Secondary | ICD-10-CM | POA: Diagnosis present

## 2014-05-29 DIAGNOSIS — IMO0001 Reserved for inherently not codable concepts without codable children: Secondary | ICD-10-CM

## 2014-05-29 DIAGNOSIS — O133 Gestational [pregnancy-induced] hypertension without significant proteinuria, third trimester: Secondary | ICD-10-CM | POA: Diagnosis present

## 2014-05-29 DIAGNOSIS — Z880 Allergy status to penicillin: Secondary | ICD-10-CM

## 2014-05-29 DIAGNOSIS — O3421 Maternal care for scar from previous cesarean delivery: Secondary | ICD-10-CM | POA: Diagnosis present

## 2014-05-29 LAB — WET PREP, GENITAL
Clue Cells Wet Prep HPF POC: NONE SEEN
Trich, Wet Prep: NONE SEEN

## 2014-05-29 LAB — URINE MICROSCOPIC-ADD ON

## 2014-05-29 LAB — URINALYSIS, ROUTINE W REFLEX MICROSCOPIC
Bilirubin Urine: NEGATIVE
Glucose, UA: NEGATIVE mg/dL
Ketones, ur: NEGATIVE mg/dL
Nitrite: NEGATIVE
PH: 6 (ref 5.0–8.0)
Protein, ur: NEGATIVE mg/dL
Urobilinogen, UA: 0.2 mg/dL (ref 0.0–1.0)

## 2014-05-29 MED ORDER — FLUCONAZOLE 150 MG PO TABS
150.0000 mg | ORAL_TABLET | Freq: Once | ORAL | Status: AC
  Administered 2014-05-29: 150 mg via ORAL
  Filled 2014-05-29: qty 1

## 2014-05-29 NOTE — MAU Note (Signed)
Pt presents complaining of contractions every 5 minutes that are lasting a minute each. Some bloody show. Denies leaking of fluid. Reports good fetal movement.

## 2014-05-29 NOTE — H&P (Signed)
Bailey Sullivan is a 34 y.o. female presenting for painful contractions. History  Patient reports contractions starting this afternoon which gradually became painful and fairly regular about every 6-7 minutes at around 8pm. Patient reports a small amount of bloody show this evening. No LOF, normal FM. Pregnancy also complicated by gestational hypertension on labetalol for the past week with plan for induction at 39 weeks.  Conceived on [name] 34 yo [place] 4th  Dating By 5wk [place]  Pap  10/15/13  Negative with -HPV  GC/CT Initial:    -/-            36+wks:  Genetic Screen NT/IT: declined  CF screen  declined  Anatomic [place] Normal female '[name]'  Flu vaccine 01/26/14  Tdap Recommended ~ 28wks  Glucose Screen  2 hr  70/144/98  GBS   Feed Preference breast  Contraception vasectomy  Circumcision n/a  Childbirth Classes declined  Pediatrician [facility]    OB History    Gravida Para Term Preterm AB TAB SAB Ectopic Multiple Living   5 3 3  0 1 0 1 0  3     Past Medical History  Diagnosis Date  . No pertinent past medical history   . Reactive airways dysfunction syndrome   . Allergy   . Breast pain, left 10/28/2012    Still breast feeding ?cyst will re check in 1 month if still there will get [place]  . Fatigue 11/25/2012  . AMA (advanced maternal age) multigravida 35+ 10/15/2013   Past Surgical History  Procedure Laterality Date  . Cesarean section    . Wisom teeth     Family History: family history includes Cancer in her mother and other; Heart attack in her maternal grandfather; Hyperlipidemia in her mother; Hypertension in her father and mother; Thyroid cancer in her mother. Social History:  reports that she has never smoked. She has never used smokeless tobacco. She reports that she does not drink alcohol or use illicit drugs.   Prenatal Transfer Tool  Maternal Diabetes: No Genetic Screening: Normal Maternal Ultrasounds/Referrals: Normal Fetal  Ultrasounds or other Referrals:  None Maternal Substance Abuse:  No Significant Maternal Medications:  Meds include: Nexium Significant Maternal Lab Results:  Lab values include: Group B Strep positive Other Comments:  None  ROS  Dilation: 3.5 Effacement (%): 50 Station: -3 Exam by:: [name] RN Blood pressure 122/75, pulse 100, temperature 98 F (36.7 C), temperature source Oral, resp. rate 18, last menstrual period 08/02/2013. Maternal Exam:  Uterine Assessment: Contraction strength is moderate.  Contraction frequency is regular.   Abdomen: Patient reports no abdominal tenderness. Surgical scars: low transverse.   Fetal presentation: vertex  Pelvis: adequate for delivery.   Cervix: Cervix evaluated by digital exam.     Fetal Exam Fetal Monitor Review: Mode: ultrasound.   Baseline rate: 135.  Variability: moderate (6-25 bpm).   Pattern: accelerations present and no decelerations.    Fetal State Assessment: Category I - tracings are normal.     Physical Exam  Prenatal labs: ABO, Rh: A/POS/-- (07/09 1538) Antibody: NEG (12/17 0918) Rubella: 2.17 (07/09 1538) RPR: NON REAC (12/17 0918)  HBsAg: NEGATIVE (07/09 1538)  HIV: NONREACTIVE (12/17 0918)  GBS:   Positive, resistant to clinda  Assessment/Plan: LaborAdmitPlan #Labor: Active labor at term, will augment if needed for gHTN #Pain: Planning epidural #FWB: category 1 #ID: GBS pos, penicillin allergic and clinda resistant     Bailey Sullivan 05/29/2014, 11:48 PM  I have seen this patient and agree with the above resident's note.  Bailey Sullivan Certified Nurse-Midwife

## 2014-05-30 ENCOUNTER — Encounter (HOSPITAL_COMMUNITY): Payer: Self-pay | Admitting: Anesthesiology

## 2014-05-30 ENCOUNTER — Inpatient Hospital Stay (HOSPITAL_COMMUNITY): Payer: 59 | Admitting: Anesthesiology

## 2014-05-30 DIAGNOSIS — Z880 Allergy status to penicillin: Secondary | ICD-10-CM | POA: Diagnosis not present

## 2014-05-30 DIAGNOSIS — Z3A38 38 weeks gestation of pregnancy: Secondary | ICD-10-CM | POA: Diagnosis present

## 2014-05-30 DIAGNOSIS — O133 Gestational [pregnancy-induced] hypertension without significant proteinuria, third trimester: Secondary | ICD-10-CM | POA: Diagnosis present

## 2014-05-30 DIAGNOSIS — Z3483 Encounter for supervision of other normal pregnancy, third trimester: Secondary | ICD-10-CM | POA: Diagnosis present

## 2014-05-30 DIAGNOSIS — O3421 Maternal care for scar from previous cesarean delivery: Secondary | ICD-10-CM | POA: Diagnosis present

## 2014-05-30 DIAGNOSIS — IMO0001 Reserved for inherently not codable concepts without codable children: Secondary | ICD-10-CM

## 2014-05-30 DIAGNOSIS — Z8249 Family history of ischemic heart disease and other diseases of the circulatory system: Secondary | ICD-10-CM | POA: Diagnosis not present

## 2014-05-30 DIAGNOSIS — O139 Gestational [pregnancy-induced] hypertension without significant proteinuria, unspecified trimester: Secondary | ICD-10-CM

## 2014-05-30 DIAGNOSIS — O09523 Supervision of elderly multigravida, third trimester: Secondary | ICD-10-CM | POA: Diagnosis present

## 2014-05-30 DIAGNOSIS — O99824 Streptococcus B carrier state complicating childbirth: Secondary | ICD-10-CM | POA: Diagnosis present

## 2014-05-30 LAB — CBC
HCT: 30 % — ABNORMAL LOW (ref 36.0–46.0)
Hemoglobin: 9.3 g/dL — ABNORMAL LOW (ref 12.0–15.0)
MCH: 23.5 pg — ABNORMAL LOW (ref 26.0–34.0)
MCHC: 31 g/dL (ref 30.0–36.0)
MCV: 75.9 fL — ABNORMAL LOW (ref 78.0–100.0)
PLATELETS: 379 10*3/uL (ref 150–400)
RBC: 3.95 MIL/uL (ref 3.87–5.11)
RDW: 16.6 % — ABNORMAL HIGH (ref 11.5–15.5)
WBC: 9.3 10*3/uL (ref 4.0–10.5)

## 2014-05-30 LAB — TYPE AND SCREEN
ABO/RH(D): A POS
Antibody Screen: NEGATIVE

## 2014-05-30 MED ORDER — PHENYLEPHRINE 40 MCG/ML (10ML) SYRINGE FOR IV PUSH (FOR BLOOD PRESSURE SUPPORT)
80.0000 ug | PREFILLED_SYRINGE | INTRAVENOUS | Status: DC | PRN
  Filled 2014-05-30: qty 2

## 2014-05-30 MED ORDER — PHENYLEPHRINE 40 MCG/ML (10ML) SYRINGE FOR IV PUSH (FOR BLOOD PRESSURE SUPPORT)
PREFILLED_SYRINGE | INTRAVENOUS | Status: AC
  Filled 2014-05-30: qty 10

## 2014-05-30 MED ORDER — BENZOCAINE-MENTHOL 20-0.5 % EX AERO
1.0000 "application " | INHALATION_SPRAY | CUTANEOUS | Status: DC | PRN
  Administered 2014-05-30: 1 via TOPICAL
  Filled 2014-05-30: qty 56

## 2014-05-30 MED ORDER — OXYCODONE-ACETAMINOPHEN 5-325 MG PO TABS: 2.0000 | ORAL_TABLET | ORAL | Status: DC | PRN

## 2014-05-30 MED ORDER — SODIUM CHLORIDE 0.9 % IV SOLN: 250.0000 mL | INTRAVENOUS | Status: DC | PRN

## 2014-05-30 MED ORDER — TETANUS-DIPHTH-ACELL PERTUSSIS 5-2.5-18.5 LF-MCG/0.5 IM SUSP
0.5000 mL | Freq: Once | INTRAMUSCULAR | Status: AC
  Administered 2014-05-30: 0.5 mL via INTRAMUSCULAR

## 2014-05-30 MED ORDER — SIMETHICONE 80 MG PO CHEW: 80.0000 mg | CHEWABLE_TABLET | ORAL | Status: DC | PRN

## 2014-05-30 MED ORDER — EPHEDRINE 5 MG/ML INJ
10.0000 mg | INTRAVENOUS | Status: DC | PRN
  Filled 2014-05-30: qty 2

## 2014-05-30 MED ORDER — DIPHENHYDRAMINE HCL 25 MG PO CAPS: 25.0000 mg | ORAL_CAPSULE | Freq: Four times a day (QID) | ORAL | Status: DC | PRN

## 2014-05-30 MED ORDER — OXYTOCIN 40 UNITS IN LACTATED RINGERS INFUSION - SIMPLE MED
1.0000 m[IU]/min | INTRAVENOUS | Status: DC
  Administered 2014-05-30: 2 m[IU]/min via INTRAVENOUS

## 2014-05-30 MED ORDER — SODIUM CHLORIDE 0.9 % IJ SOLN: 3.0000 mL | INTRAMUSCULAR | Status: DC | PRN

## 2014-05-30 MED ORDER — OXYTOCIN 40 UNITS IN LACTATED RINGERS INFUSION - SIMPLE MED
INTRAVENOUS | Status: AC
  Administered 2014-05-30: 62.5 mL/h via INTRAVENOUS
  Filled 2014-05-30: qty 1000

## 2014-05-30 MED ORDER — OXYCODONE-ACETAMINOPHEN 5-325 MG PO TABS: 1.0000 | ORAL_TABLET | ORAL | Status: DC | PRN

## 2014-05-30 MED ORDER — OXYTOCIN 40 UNITS IN LACTATED RINGERS INFUSION - SIMPLE MED: 62.5000 mL/h | INTRAVENOUS | Status: DC | PRN

## 2014-05-30 MED ORDER — LACTATED RINGERS IV SOLN
INTRAVENOUS | Status: DC
  Administered 2014-05-30 (×2): via INTRAVENOUS

## 2014-05-30 MED ORDER — OXYCODONE-ACETAMINOPHEN 5-325 MG PO TABS
1.0000 | ORAL_TABLET | ORAL | Status: DC | PRN
Start: 2014-05-30 — End: 2014-05-31

## 2014-05-30 MED ORDER — PRENATAL MULTIVITAMIN CH
1.0000 | ORAL_TABLET | Freq: Every day | ORAL | Status: DC
  Administered 2014-05-30: 1 via ORAL
  Filled 2014-05-30: qty 1

## 2014-05-30 MED ORDER — WITCH HAZEL-GLYCERIN EX PADS: 1.0000 "application " | MEDICATED_PAD | CUTANEOUS | Status: DC | PRN

## 2014-05-30 MED ORDER — FLEET ENEMA 7-19 GM/118ML RE ENEM: 1.0000 | ENEMA | Freq: Every day | RECTAL | Status: DC | PRN

## 2014-05-30 MED ORDER — LIDOCAINE HCL (PF) 1 % IJ SOLN
INTRAMUSCULAR | Status: DC | PRN
  Administered 2014-05-30 (×2): 8 mL

## 2014-05-30 MED ORDER — TERBUTALINE SULFATE 1 MG/ML IJ SOLN
0.2500 mg | Freq: Once | INTRAMUSCULAR | Status: DC | PRN
  Filled 2014-05-30: qty 1

## 2014-05-30 MED ORDER — ZOLPIDEM TARTRATE 5 MG PO TABS: 5.0000 mg | ORAL_TABLET | Freq: Every evening | ORAL | Status: DC | PRN

## 2014-05-30 MED ORDER — DIPHENHYDRAMINE HCL 50 MG/ML IJ SOLN: 12.5000 mg | INTRAMUSCULAR | Status: DC | PRN

## 2014-05-30 MED ORDER — VANCOMYCIN HCL IN DEXTROSE 1-5 GM/200ML-% IV SOLN
1000.0000 mg | Freq: Two times a day (BID) | INTRAVENOUS | Status: DC
  Administered 2014-05-30: 1000 mg via INTRAVENOUS
  Filled 2014-05-30 (×2): qty 200

## 2014-05-30 MED ORDER — LACTATED RINGERS IV SOLN: 500.0000 mL | Freq: Once | INTRAVENOUS | Status: DC

## 2014-05-30 MED ORDER — FENTANYL 2.5 MCG/ML BUPIVACAINE 1/10 % EPIDURAL INFUSION (WH - ANES)
14.0000 mL/h | INTRAMUSCULAR | Status: DC | PRN
  Administered 2014-05-30 (×2): 14 mL/h via EPIDURAL
  Filled 2014-05-30: qty 125

## 2014-05-30 MED ORDER — BISACODYL 10 MG RE SUPP: 10.0000 mg | Freq: Every day | RECTAL | Status: DC | PRN

## 2014-05-30 MED ORDER — ACETAMINOPHEN 325 MG PO TABS: 650.0000 mg | ORAL_TABLET | ORAL | Status: DC | PRN

## 2014-05-30 MED ORDER — ONDANSETRON HCL 4 MG/2ML IJ SOLN
4.0000 mg | Freq: Four times a day (QID) | INTRAMUSCULAR | Status: DC | PRN
  Administered 2014-05-30: 4 mg via INTRAVENOUS
  Filled 2014-05-30: qty 2

## 2014-05-30 MED ORDER — ONDANSETRON HCL 4 MG PO TABS: 4.0000 mg | ORAL_TABLET | ORAL | Status: DC | PRN

## 2014-05-30 MED ORDER — SODIUM CHLORIDE 0.9 % IJ SOLN
3.0000 mL | Freq: Two times a day (BID) | INTRAMUSCULAR | Status: DC
  Administered 2014-05-30: 3 mL via INTRAVENOUS

## 2014-05-30 MED ORDER — IBUPROFEN 600 MG PO TABS
600.0000 mg | ORAL_TABLET | Freq: Four times a day (QID) | ORAL | Status: DC
  Administered 2014-05-30 – 2014-05-31 (×4): 600 mg via ORAL
  Filled 2014-05-30 (×4): qty 1

## 2014-05-30 MED ORDER — LIDOCAINE HCL (PF) 1 % IJ SOLN
INTRAMUSCULAR | Status: AC
  Filled 2014-05-30: qty 30

## 2014-05-30 MED ORDER — LIDOCAINE HCL (PF) 1 % IJ SOLN
30.0000 mL | INTRAMUSCULAR | Status: DC | PRN
  Filled 2014-05-30: qty 30

## 2014-05-30 MED ORDER — ONDANSETRON HCL 4 MG/2ML IJ SOLN: 4.0000 mg | INTRAMUSCULAR | Status: DC | PRN

## 2014-05-30 MED ORDER — LACTATED RINGERS IV SOLN: 500.0000 mL | INTRAVENOUS | Status: DC | PRN

## 2014-05-30 MED ORDER — CITRIC ACID-SODIUM CITRATE 334-500 MG/5ML PO SOLN: 30.0000 mL | ORAL | Status: DC | PRN

## 2014-05-30 MED ORDER — LANOLIN HYDROUS EX OINT: TOPICAL_OINTMENT | CUTANEOUS | Status: DC | PRN

## 2014-05-30 MED ORDER — OXYTOCIN BOLUS FROM INFUSION: 500.0000 mL | INTRAVENOUS | Status: DC

## 2014-05-30 MED ORDER — FENTANYL 2.5 MCG/ML BUPIVACAINE 1/10 % EPIDURAL INFUSION (WH - ANES)
INTRAMUSCULAR | Status: AC
  Administered 2014-05-30: 14 mL/h via EPIDURAL
  Filled 2014-05-30: qty 125

## 2014-05-30 MED ORDER — SENNOSIDES-DOCUSATE SODIUM 8.6-50 MG PO TABS
2.0000 | ORAL_TABLET | ORAL | Status: DC
  Administered 2014-05-31: 2 via ORAL
  Filled 2014-05-30: qty 2

## 2014-05-30 MED ORDER — DIBUCAINE 1 % RE OINT: 1.0000 "application " | TOPICAL_OINTMENT | RECTAL | Status: DC | PRN

## 2014-05-30 MED ORDER — OXYTOCIN 40 UNITS IN LACTATED RINGERS INFUSION - SIMPLE MED
62.5000 mL/h | INTRAVENOUS | Status: DC
  Administered 2014-05-30: 62.5 mL/h via INTRAVENOUS

## 2014-05-30 MED ORDER — FENTANYL 2.5 MCG/ML BUPIVACAINE 1/10 % EPIDURAL INFUSION (WH - ANES)
INTRAMUSCULAR | Status: DC | PRN
  Administered 2014-05-30: 14 mL/h via EPIDURAL

## 2014-05-30 NOTE — MAU Note (Signed)
Report to Dub Amis.  Patient to be admitted to room 168.

## 2014-05-30 NOTE — Anesthesia Preprocedure Evaluation (Signed)

## 2014-05-30 NOTE — Progress Notes (Signed)
Bailey Sullivan is a 34 y.o. [phone] at [redacted]w[redacted]d admitted for active labor.  Her hx is significant for C/S x 1 followed by VBAC x 2.    Subjective: Pt comfortable with epidural.  Husband in room for support.  Objective: BP 113/79 mmHg  Pulse 87  Temp(Src) 98.6 F (37 C) (Oral)  Resp 16  Ht 5\' 3"  (1.6 m)  Wt 78.472 kg (173 lb)  BMI 30.65 kg/m2  SpO2 99%  LMP 08/02/2013      FHT:  FHR: 135 bpm, variability: moderate,  accelerations:  Present,  decelerations:  Absent UC:   regular, every 5 minutes SVE:   Dilation: 5 Effacement (%): 70 Station: -3 Exam by:: Lattie Haw, CNM  Labs: Lab Results  Component Value Date   WBC 9.3 05/30/2014   HGB 9.3* 05/30/2014   HCT 30.0* 05/30/2014   MCV 75.9* 05/30/2014   PLT 379 05/30/2014    Assessment / Plan: Spontaneous labor, progressing normally  Labor: Progressing normally Preeclampsia:  n/a Fetal Wellbeing:  Category I Pain Control:  Epidural I/D:  n/a Anticipated MOD:  VBAC  Bailey Sullivan, Bailey Sullivan 05/30/2014, 3:41 AM

## 2014-05-30 NOTE — Anesthesia Procedure Notes (Signed)
Epidural Patient location during procedure: OB Start time: 05/30/2014 1:15 AM End time: 05/30/2014 1:19 AM  Staffing Anesthesiologist: Lyn Hollingshead Performed by: anesthesiologist   Preanesthetic Checklist Completed: patient identified, surgical consent, pre-op evaluation, timeout performed, IV checked, risks and benefits discussed and monitors and equipment checked  Epidural Patient position: sitting Prep: site prepped and draped and DuraPrep Patient monitoring: continuous pulse ox and blood pressure Approach: midline Location: L3-L4 Injection technique: LOR air  Needle:  Needle type: Tuohy  Needle gauge: 17 G Needle length: 9 cm and 9 Needle insertion depth: 5 cm cm Catheter type: closed end flexible Catheter size: 19 Gauge Catheter at skin depth: 10 cm Test dose: negative and Other  Assessment Sensory level: T9 Events: blood not aspirated, injection not painful, no injection resistance, negative IV test and no paresthesia  Additional Notes Reason for block:procedure for pain

## 2014-05-30 NOTE — Progress Notes (Signed)
SMERA GUYETTE is a 34 y.o. [phone] at [redacted]w[redacted]d admitted for active labor  Subjective: Feeling comfortable with epidural in place  Objective: BP 109/68 mmHg  Pulse 80  Temp(Src) 98.4 F (36.9 C) (Oral)  Resp 16  Ht 5\' 3"  (1.6 m)  Wt 173 lb (78.472 kg)  BMI 30.65 kg/m2  SpO2 99%  LMP 08/02/2013     FHT:  FHR: 150 bpm, variability: moderate,  accelerations:  Present,  decelerations:  Absent UC:   irregular, every 2-7 minutes SVE:   Dilation: 5 Effacement (%): 70 Station: -3 Exam by:: Dr. Sherril Cong  Labs: Lab Results  Component Value Date   WBC 9.3 05/30/2014   HGB 9.3* 05/30/2014   HCT 30.0* 05/30/2014   MCV 75.9* 05/30/2014   PLT 379 05/30/2014    Assessment / Plan: SOL, minimal progress since epidural.  Labor: Will start augmentation with pit given gHTN Preeclampsia:  no signs or symptoms of toxicity Fetal Wellbeing:  Category I Pain Control:  Epidural I/D:  GBS pos on vanc Anticipated MOD:  NSVD  [name] 05/30/2014, 6:25 AM

## 2014-05-31 LAB — RPR: RPR: NONREACTIVE

## 2014-05-31 MED ORDER — IBUPROFEN 600 MG PO TABS: 600.0000 mg | ORAL_TABLET | Freq: Four times a day (QID) | ORAL | Status: DC

## 2014-05-31 NOTE — Lactation Note (Signed)
This note was copied from the chart of Bailey Sullivan. Lactation Consultation Note Experienced BF mom for 15 months and 16 months to her 1st and 2nd child. Plans on the same for this baby. Denies painful latching or difficulty. Mom encouraged to feed baby 8-12 times/24 hours and with feeding cues. Mom encouraged to do skin-to-skin.Mom encouraged to waken baby for feeds. educated about newborn behaviorWH/LC brochure given w/resources, support groups and [facility] services. Patient Name: Bailey Sullivan [id number] Date: 05/31/2014 Reason for consult: Initial assessment   Maternal Data Has patient been taught Hand Expression?: Yes Does the patient have breastfeeding experience prior to this delivery?: Yes  Feeding Feeding Type: Breast Fed Length of feed: 25 min  LATCH Score/Interventions                      Lactation Tools Discussed/Used     Consult Status Consult Status: PRN Date: 06/01/14 Follow-up type: In-patient    Theodoro Kalata 05/31/2014, 6:38 AM

## 2014-05-31 NOTE — Discharge Summary (Signed)
Obstetric Discharge Summary Reason for Admission: onset of labor Prenatal Procedures: NST Intrapartum Procedures: spontaneous vaginal delivery Postpartum Procedures: none Complications-Operative and Postpartum: 2nd degree perineal laceration HEMOGLOBIN  Date Value Ref Range Status  05/30/2014 9.3* 12.0 - 15.0 g/dL Final   HCT  Date Value Ref Range Status  05/30/2014 30.0* 36.0 - 46.0 % Final  Hospital Course  Expand All Collapse All    Bailey Sullivan is a 34 y.o. female presenting for painful contractions. History  Patient reports contractions starting this afternoon which gradually became painful and fairly regular about every 6-7 minutes at around 8pm. Patient reports a small amount of bloody show this evening. No LOF, normal FM. Pregnancy also complicated by gestational hypertension on labetalol for the past week with plan for induction at 39 weeks.       Expand All Collapse All   Patient is 34 y.o. [id number] [redacted]w[redacted]d admitted in active labor for TOL, hx of prev c/s followed by 2 VBAC, gHTN  Delivery Note At 9:07 AM a viable female was delivered via VBAC, Spontaneous (Presentation: ; ). APGAR: 9, 9; weight 7 lb 3.2 oz (3265 g).  Placenta status: Intact, Spontaneous. Cord: 3 vessels with the following complications: None.  Anesthesia: Epidural  Episiotomy: None Lacerations: 2nd degree;Sulcus Suture Repair: 3.0 vicryl rapide Est. Blood Loss (mL): 322mL  Mom to postpartum. Baby to Couplet care / Skin to Skin.  Sullivan,Bailey ROCIO 05/30/2014, 9:37 AM           Physical Exam:  General: alert, cooperative and no distress Lochia: appropriate Uterine Fundus: firm Incision: healing well, no significant drainage DVT Evaluation: No evidence of DVT seen on physical exam.  Discharge Diagnoses: Term Pregnancy-delivered  Discharge Information: Date: 05/31/2014 Activity: unrestricted and pelvic rest Diet: routine Medications: PNV and Ibuprofen Condition: stable and  improved Instructions: refer to practice specific booklet Discharge to: home   Newborn Data: Live born female  Birth Weight: 7 lb 3.2 oz (3265 g) APGAR: 9, 9  Home with mother.  Bailey Sullivan 05/31/2014, 8:11 AM

## 2014-05-31 NOTE — Anesthesia Postprocedure Evaluation (Signed)
  Anesthesia Post-op Note  Anesthesia Post Note  Patient: Bailey Sullivan  Procedure(s) Performed: * No procedures listed *  Anesthesia type: Epidural  Patient location: Mother/Baby  Post pain: Pain level controlled  Post assessment: Post-op Vital signs reviewed  Last Vitals:  Filed Vitals:   05/31/14 0620  BP: 118/71  Pulse: 72  Temp: 36.7 C  Resp: 18    Post vital signs: Reviewed  Level of consciousness:alert  Complications: No apparent anesthesia complications

## 2014-06-01 ENCOUNTER — Other Ambulatory Visit: Payer: 59

## 2014-06-01 ENCOUNTER — Encounter: Payer: 59 | Admitting: Obstetrics & Gynecology

## 2014-06-03 ENCOUNTER — Telehealth (HOSPITAL_COMMUNITY): Payer: Self-pay | Admitting: *Deleted

## 2014-06-03 NOTE — Telephone Encounter (Signed)
Preadmission screen  

## 2014-06-05 ENCOUNTER — Inpatient Hospital Stay (HOSPITAL_COMMUNITY): Admission: RE | Admit: 2014-06-05 | Payer: 59 | Source: Ambulatory Visit

## 2014-06-07 NOTE — Progress Notes (Signed)
Post discharge chart review completed.  

## 2014-06-15 ENCOUNTER — Ambulatory Visit (INDEPENDENT_AMBULATORY_CARE_PROVIDER_SITE_OTHER): Payer: 59 | Admitting: Obstetrics & Gynecology

## 2014-06-15 ENCOUNTER — Encounter: Payer: Self-pay | Admitting: Obstetrics & Gynecology

## 2014-06-15 VITALS — BP 120/90 | HR 80 | Wt 160.0 lb

## 2014-06-15 DIAGNOSIS — Z3009 Encounter for other general counseling and advice on contraception: Secondary | ICD-10-CM | POA: Diagnosis not present

## 2014-06-15 DIAGNOSIS — O169 Unspecified maternal hypertension, unspecified trimester: Secondary | ICD-10-CM | POA: Diagnosis not present

## 2014-06-15 DIAGNOSIS — O165 Unspecified maternal hypertension, complicating the puerperium: Secondary | ICD-10-CM

## 2014-06-15 MED ORDER — NORETHINDRONE 0.35 MG PO TABS: 1.0000 | ORAL_TABLET | Freq: Every day | ORAL | Status: DC

## 2014-06-15 NOTE — Progress Notes (Signed)
Patient ID: Bailey Sullivan, female   DOB: [birth year], 34 y.o.   MRN: [id number]   Chief Complaint  Patient presents with  . 2 week vaginal delivery    has episiotomy/ check B/P.     HPI:    Pt delivered 05/30/2014 was on labetalol, taken off post partum Also had episiotomy no complaints with it Location:  perineal. Quality:  . Severity:  . Timing:  . Duration:  . Context:  . Modifying factors:   Signs/Symptoms:    Problem Pertinent ROS:         Extended ROS:        [place]:             Past Medical History  Diagnosis Date  . No pertinent past medical history   . Reactive airways dysfunction syndrome   . Allergy   . Breast pain, left 10/28/2012    Still breast feeding ?cyst will re check in 1 month if still there will get [place]  . Fatigue 11/25/2012  . AMA (advanced maternal age) multigravida 35+ 10/15/2013    Past Surgical History  Procedure Laterality Date  . Cesarean section    . Wisom teeth      OB History    Gravida Para Term Preterm AB TAB SAB Ectopic Multiple Living   5 4 4  0 1 0 1 0 0 2      Allergies  Allergen Reactions  . Amoxicillin Hives  . Sulfa Antibiotics Hives    History   Social History  . Marital Status: Married    Spouse Name: N/A  . Number of Children: N/A  . Years of Education: N/A   Social History Main Topics  . Smoking status: Never Smoker   . Smokeless tobacco: Never Used  . Alcohol Use: No  . Drug Use: No  . Sexual Activity: Not Currently    Birth Control/ Protection: None   Other Topics Concern  . None   Social History Narrative    Family History  Problem Relation Age of Onset  . Cancer Mother     breast  . Hypertension Mother   . Hyperlipidemia Mother   . Thyroid cancer Mother     had thyroid removed  . Hypertension Father   . Heart attack Maternal Grandfather   . Cancer Other     leukemia; dad's paternal aunt     Examination:  Vitals:  Blood pressure 120/90, pulse 80, weight 160 lb (72.576 kg), unknown  if currently breastfeeding.    Physical Examination:     Abdomen:   Vulva:  NEFG, normal appearing vulva with no masses, tenderness or lesions, normal appearing healing from episiotomy Vagina:  ,      DATA orders and reviews: Labs were not ordered today:   Imaging studies were notordered today:    Lab tests were not reviewed today:    Imaging studies were not reviewed today:    I did not independently review/view images, tracing or specimen(not simply the report) myself.  Prescription Drug Management:  New Prescriptions: Micronor Renewed Prescriptions:   Current prescription changes:     Impression/Plan(Problem Based): 1.  Gestational hypertension, now paot partum      (follow up of a pre-existing problem) : Additional workup is not needed:  No need to restart labetalol will eave her off for now  2.  Contraception      (new problem) : Additional workup is not needed:     Follow Up:   4  weeks post partum exam     Face to face time:  na

## 2014-07-13 ENCOUNTER — Ambulatory Visit (INDEPENDENT_AMBULATORY_CARE_PROVIDER_SITE_OTHER): Payer: 59 | Admitting: Women's Health

## 2014-07-13 ENCOUNTER — Encounter: Payer: Self-pay | Admitting: Women's Health

## 2014-07-13 DIAGNOSIS — Z8759 Personal history of other complications of pregnancy, childbirth and the puerperium: Secondary | ICD-10-CM

## 2014-07-13 NOTE — Progress Notes (Signed)
Patient ID: Bailey Sullivan, female   DOB: 05/23/1980, 34 y.o.   MRN: [id number] Subjective:    Bailey Sullivan is a 34 y.o. [id number] female who presents for a postpartum visit. She is 6 weeks postpartum following a vaginal birth after cesarean (VBAC) at 24 gestational weeks. Anesthesia: epidural. I have fully reviewed the prenatal and intrapartum course. Pregnancy complicated by GHTN. Postpartum course has been uncomplicated. Baby's course has been uncomplicated. Baby is feeding by breast. Bleeding no bleeding. Bowel function is normal. Bladder function is normal. Patient is not sexually active. Last sexual activity: prior to birth of baby. Contraception method is oral progesterone-only contraceptive and husband has consultation w/ urologist for vasectomy first of May. Postpartum depression screening: negative. Score 1.  Last pap 10/15/13 and was neg w/ -HRPV.  The following portions of the patient's history were reviewed and updated as appropriate: allergies, current medications, past medical history, past surgical history and problem list.  Review of Systems Pertinent items are noted in HPI.   Filed Vitals:   07/13/14 1119  BP: 128/60  Pulse: 76  Weight: 155 lb (70.308 kg)   No LMP recorded.  Objective:   General:  alert, cooperative and no distress   Breasts:  deferred, no complaints  Lungs: clear to auscultation bilaterally  Heart:  regular rate and rhythm  Abdomen: soft, nontender   Vulva: normal  Vagina: normal vagina  Cervix:  closed  Corpus: Well-involuted  Adnexa:  Non-palpable  Rectal Exam: No hemorrhoids        Assessment:   Postpartum exam 6 wks s/p VBAC Breastfeeding Depression screening Contraception counseling   Plan:   Contraception: oral progesterone-only contraceptive, husband plans vasectomy- to continue pills until husband gets 'all clear' from MD Follow up in: 1 year for physical or earlier if needed  Tawnya Crook CNM,  [facility] 07/13/2014 11:40 AM

## 2014-08-02 ENCOUNTER — Other Ambulatory Visit: Payer: Self-pay | Admitting: *Deleted

## 2014-08-02 ENCOUNTER — Telehealth: Payer: Self-pay | Admitting: Family Medicine

## 2014-08-02 MED ORDER — KETOCONAZOLE 2 % EX CREA: 1.0000 "application " | TOPICAL_CREAM | Freq: Two times a day (BID) | CUTANEOUS | Status: DC

## 2014-08-02 NOTE — Telephone Encounter (Signed)
Med sent to pharm. Pt notified.  

## 2014-08-02 NOTE — Telephone Encounter (Signed)
Pt called stating that she brought her 41 month old in and she is being treated For thrush. Pt believes she now has nipple thrush because of her 34 month old Being breast fed. Pt wants to know what she should do to treat it.   Bailey Sullivan

## 2014-08-02 NOTE — Telephone Encounter (Signed)
Having pain and soreness in nipple area, shooting pains, area is red. Started a couple of weeks ago. Worse last few days.

## 2014-08-02 NOTE — Telephone Encounter (Signed)
Ketoconazole cr bid to affected area, safe with br feeding, 30 g two ref

## 2014-11-29 ENCOUNTER — Ambulatory Visit (INDEPENDENT_AMBULATORY_CARE_PROVIDER_SITE_OTHER): Payer: 59 | Admitting: Family Medicine

## 2014-11-29 ENCOUNTER — Encounter: Payer: Self-pay | Admitting: Family Medicine

## 2014-11-29 VITALS — BP 110/80 | Temp 98.0°F | Ht 63.0 in | Wt 154.0 lb

## 2014-11-29 DIAGNOSIS — B9689 Other specified bacterial agents as the cause of diseases classified elsewhere: Secondary | ICD-10-CM

## 2014-11-29 DIAGNOSIS — J019 Acute sinusitis, unspecified: Secondary | ICD-10-CM

## 2014-11-29 MED ORDER — AZITHROMYCIN 250 MG PO TABS: ORAL_TABLET | ORAL | Status: DC

## 2014-11-29 NOTE — Progress Notes (Signed)
   Subjective:    Patient ID: Bailey Sullivan, female    DOB: 1980/09/28, 34 y.o.   MRN: [id number]  Sinusitis This is a new problem. The current episode started 1 to 4 weeks ago. The problem has been gradually worsening since onset. There has been no fever. The pain is moderate. Associated symptoms include congestion, coughing, headaches and sinus pressure. Pertinent negatives include no ear pain or shortness of breath. Past treatments include oral decongestants. The treatment provided no relief.   Sore throat congestion and frontal Ha and presuure and cough Some wheeze using inhaler helps some  History of asthma Review of Systems  Constitutional: Negative for fever and activity change.  HENT: Positive for congestion, rhinorrhea and sinus pressure. Negative for ear pain.   Eyes: Negative for discharge.  Respiratory: Positive for cough. Negative for shortness of breath and wheezing.   Cardiovascular: Negative for chest pain.  Neurological: Positive for headaches.       Objective:   Physical Exam  Constitutional: She appears well-developed.  HENT:  Head: Normocephalic.  Nose: Nose normal.  Mouth/Throat: Oropharynx is clear and moist. No oropharyngeal exudate.  Neck: Neck supple.  Cardiovascular: Normal rate and normal heart sounds.   No murmur heard. Pulmonary/Chest: Effort normal and breath sounds normal. She has no wheezes.  Lymphadenopathy:    She has no cervical adenopathy.  Skin: Skin is warm and dry.  Nursing note and vitals reviewed.         Assessment & Plan:  Viral syndrome secondary sinusitis antibiotics discussed, with patient allergies to use azithromycin. Follow-up if progressive troubles.  No asthma flareup currently may use albuterol when necessary

## 2015-01-31 ENCOUNTER — Other Ambulatory Visit: Payer: Self-pay | Admitting: Family Medicine

## 2015-02-07 ENCOUNTER — Ambulatory Visit (INDEPENDENT_AMBULATORY_CARE_PROVIDER_SITE_OTHER): Payer: BLUE CROSS/BLUE SHIELD | Admitting: Nurse Practitioner

## 2015-02-07 ENCOUNTER — Encounter: Payer: Self-pay | Admitting: Nurse Practitioner

## 2015-02-07 VITALS — BP 126/84 | HR 105 | Ht 63.0 in | Wt 152.5 lb

## 2015-02-07 DIAGNOSIS — J069 Acute upper respiratory infection, unspecified: Secondary | ICD-10-CM | POA: Diagnosis not present

## 2015-02-07 DIAGNOSIS — J4521 Mild intermittent asthma with (acute) exacerbation: Secondary | ICD-10-CM

## 2015-02-07 MED ORDER — PREDNISONE 20 MG PO TABS: ORAL_TABLET | ORAL | Status: DC

## 2015-02-07 MED ORDER — AZITHROMYCIN 250 MG PO TABS: ORAL_TABLET | ORAL | Status: DC

## 2015-02-07 MED ORDER — ALBUTEROL SULFATE (2.5 MG/3ML) 0.083% IN NEBU
2.5000 mg | INHALATION_SOLUTION | Freq: Once | RESPIRATORY_TRACT | Status: AC
  Administered 2015-02-07: 2.5 mg via RESPIRATORY_TRACT

## 2015-02-08 ENCOUNTER — Encounter: Payer: Self-pay | Admitting: Nurse Practitioner

## 2015-02-08 NOTE — Progress Notes (Signed)
Subjective:  Presents for complaints of a flareup of her asthma for the past week. Progressively worse. Over the past 3 days using her albuterol inhaler every 2-3 hours. Last used about 3-1/2 hours ago. No fever. No sore throat or ear pain. Occasional headache. Head congestion/runny nose. Frequent nonproductive cough.  Objective:   BP 126/84 mmHg  Pulse 105  Ht 5\' 3"  (1.6 m)  Wt 152 lb 8 oz (69.174 kg)  BMI 27.02 kg/m2  SpO2 97% NAD. Alert, oriented. TMs clear effusion, no erythema. Pharynx mildly erythematous with PND noted. Neck supple with mild soft anterior adenopathy. Mild shortness of breath noted with talking. Normal color. Lungs initially mildly diminished breath sounds in general, faint expiratory wheezes noted throughout lung fields. Given albuterol 2.5 mg nebulizer treatment. Wheezing resolved with improved airflow and subjective improvement of symptoms. Heart regular rate rhythm.  Assessment: Asthma, mild intermittent, with acute exacerbation - Plan: albuterol (PROVENTIL) (2.5 MG/3ML) 0.083% nebulizer solution 2.5 mg  Acute upper respiratory infection - Plan: albuterol (PROVENTIL) (2.5 MG/3ML) 0.083% nebulizer solution 2.5 mg  Plan:  Meds ordered this encounter  Medications  . predniSONE (DELTASONE) 20 MG tablet    Sig: 3 po qd x 3 d then 2 po qd x 3 d then 1 po qd x 3 d    Dispense:  18 tablet    Refill:  0    Order Specific Question:  Supervising Provider    Answer:  Mikey Kirschner [[id number]]  . azithromycin (ZITHROMAX Z-PAK) 250 MG tablet    Sig: Take 2 tablets (500 mg) on  Day 1,  followed by 1 tablet (250 mg) once daily on Days 2 through 5.    Dispense:  6 each    Refill:  0    Order Specific Question:  Supervising Provider    Answer:  Mikey Kirschner [[id number]]  . albuterol (PROVENTIL) (2.5 MG/3ML) 0.083% nebulizer solution 2.5 mg    Sig:    Patient is breast-feeding. Also has some allergies to certain antibiotics. OTC meds as directed for congestion and cough.  Continue albuterol inhaler as directed. Call back in 72 hours if no improvement in symptoms, call or go to ED sooner if worse.

## 2015-02-15 ENCOUNTER — Ambulatory Visit (INDEPENDENT_AMBULATORY_CARE_PROVIDER_SITE_OTHER): Payer: BLUE CROSS/BLUE SHIELD

## 2015-02-15 DIAGNOSIS — Z23 Encounter for immunization: Secondary | ICD-10-CM

## 2015-03-26 ENCOUNTER — Other Ambulatory Visit: Payer: Self-pay | Admitting: Family Medicine

## 2015-06-20 ENCOUNTER — Other Ambulatory Visit: Payer: Self-pay | Admitting: Obstetrics & Gynecology

## 2015-08-03 ENCOUNTER — Other Ambulatory Visit: Payer: Self-pay | Admitting: Family Medicine

## 2015-08-11 ENCOUNTER — Ambulatory Visit (INDEPENDENT_AMBULATORY_CARE_PROVIDER_SITE_OTHER): Payer: BLUE CROSS/BLUE SHIELD | Admitting: Family Medicine

## 2015-08-11 ENCOUNTER — Telehealth: Payer: Self-pay | Admitting: Family Medicine

## 2015-08-11 ENCOUNTER — Encounter: Payer: Self-pay | Admitting: Family Medicine

## 2015-08-11 VITALS — BP 110/80 | Temp 98.5°F | Ht 63.0 in | Wt 150.1 lb

## 2015-08-11 DIAGNOSIS — J45901 Unspecified asthma with (acute) exacerbation: Secondary | ICD-10-CM | POA: Insufficient documentation

## 2015-08-11 DIAGNOSIS — J4521 Mild intermittent asthma with (acute) exacerbation: Secondary | ICD-10-CM | POA: Diagnosis not present

## 2015-08-11 MED ORDER — PREDNISONE 20 MG PO TABS: ORAL_TABLET | ORAL | Status: DC

## 2015-08-11 MED ORDER — ALBUTEROL SULFATE HFA 108 (90 BASE) MCG/ACT IN AERS: INHALATION_SPRAY | RESPIRATORY_TRACT | Status: DC

## 2015-08-11 NOTE — Telephone Encounter (Signed)
Spoke with patient and informed her that she would need to seen for asthma. Patient verbalized understanding.

## 2015-08-11 NOTE — Telephone Encounter (Signed)
Pt called stating that her allergies are starting to bother her asthma and wants to know if something can be called in or if she will need to be seen. Please advise.

## 2015-08-11 NOTE — Progress Notes (Signed)
   Subjective:    Patient ID: Bailey Sullivan, female    DOB: 1981-04-02, 35 y.o.   MRN: [id number]  Wheezing  This is a new problem. The current episode started in the past 7 days. The problem occurs intermittently. The problem has been unchanged. Associated symptoms include shortness of breath. Nothing aggravates the symptoms. Treatments tried: inhaler. The treatment provided mild relief.   Patient relates over the past few days increase use of inhaler waking up several times at night feeling tight no lungs using inhaler more often chest tightness coughing denies fever chills sweats PMH intermittent asthma   Review of Systems  Respiratory: Positive for shortness of breath and wheezing.        Objective:   Physical Exam No wheeze currently no respiratory distress no crackles or rails heart regular HEENT benign       Assessment & Plan:  Allergic rhinitis-importance of allergy medicine discuss. Options discussed.  Intermittent asthma albuterol as needed as well as prednisone taper if more frequent spells may need to be on steroid inhaler

## 2015-08-18 ENCOUNTER — Telehealth: Payer: Self-pay | Admitting: Family Medicine

## 2015-08-18 MED ORDER — AZITHROMYCIN 250 MG PO TABS: ORAL_TABLET | ORAL | Status: DC

## 2015-08-18 NOTE — Telephone Encounter (Signed)
Pt seen on 5/4 by Dr Nicki Reaper, and was told to call back if her allergies  Turned to a sinus infec  Pt states her mucus yellow/green gunky looking and would like  Something called into [facility] in [place]

## 2015-08-18 NOTE — Telephone Encounter (Signed)
zpk

## 2015-08-18 NOTE — Telephone Encounter (Signed)
Spoke with patient to discuss patient's symptoms.  Patient has c/o of sinus pressure, headache, nasal congestion with yellow/green thick mucus. Patient denies fever. Onset of symptoms yesterday. Please advise?

## 2015-08-18 NOTE — Telephone Encounter (Signed)
Med sent to pharmacy. Patient was notified.  

## 2015-10-21 ENCOUNTER — Ambulatory Visit (INDEPENDENT_AMBULATORY_CARE_PROVIDER_SITE_OTHER): Payer: BLUE CROSS/BLUE SHIELD | Admitting: Adult Health

## 2015-10-21 ENCOUNTER — Encounter: Payer: Self-pay | Admitting: Adult Health

## 2015-10-21 VITALS — BP 130/80 | HR 82 | Ht 62.5 in | Wt 151.0 lb

## 2015-10-21 DIAGNOSIS — Z3041 Encounter for surveillance of contraceptive pills: Secondary | ICD-10-CM

## 2015-10-21 DIAGNOSIS — Z01419 Encounter for gynecological examination (general) (routine) without abnormal findings: Secondary | ICD-10-CM

## 2015-10-21 DIAGNOSIS — Z309 Encounter for contraceptive management, unspecified: Secondary | ICD-10-CM | POA: Insufficient documentation

## 2015-10-21 HISTORY — DX: Encounter for contraceptive management, unspecified: Z30.9

## 2015-10-21 MED ORDER — NORETHIN-ETH ESTRAD-FE BIPHAS 1 MG-10 MCG / 10 MCG PO TABS: 1.0000 | ORAL_TABLET | Freq: Every day | ORAL | Status: DC

## 2015-10-21 NOTE — Patient Instructions (Signed)
Pap and physical in 1 year Start lo loestrin Sunday use condoms

## 2015-10-21 NOTE — Progress Notes (Signed)
Patient ID: Bailey Sullivan, female   DOB: 1981/01/25, 35 y.o.   MRN: [id number] History of Present Illness: Bailey Sullivan is a 35 year old white female, married in for a well woman gyn exam,she had a normal pap with negative HPV 10/15/13.She has stopped breastfeeding and wants COC now. PCP is [facility].   Current Medications, Allergies, Past Medical History, Past Surgical History, Family History and Social History were reviewed in [employer] record.     Review of Systems: Patient denies any headaches, hearing loss, fatigue, blurred vision, shortness of breath, chest pain, abdominal pain, problems with bowel movements, urination, or intercourse. No joint pain or mood swings.    Physical Exam:BP 130/80 mmHg  Pulse 82  Ht 5' 2.5" (1.588 m)  Wt 151 lb (68.493 kg)  BMI 27.16 kg/m2  LMP 10/21/2015  Breastfeeding? No General:  Well developed, well nourished, no acute distress Skin:  Warm and dry Neck:  Midline trachea, normal thyroid, good ROM, no lymphadenopathy Lungs; Clear to auscultation bilaterally Breast:  No dominant palpable mass, retraction, or nipple discharge Cardiovascular: Regular rate and rhythm Abdomen:  Soft, non tender, no hepatosplenomegaly Pelvic:  External genitalia is normal in appearance, no lesions.  The vagina is normal in appearance. Urethra has no lesions or masses. The cervix is bulbous.+period like blood.  Uterus is felt to be normal size, shape, and contour.  No adnexal masses or tenderness noted.Bladder is non tender, no masses felt. Extremities/musculoskeletal:  No swelling or varicosities noted, no clubbing or cyanosis Psych:  No mood changes, alert and cooperative,seems happy   Impression:  Well woman gyn exam, no pap Contraceptive management   Plan: Rx lo loestrin disp #3 packs with 4 refills, take 1 daily, start Sunday, use discount card Pap and physical in 1 year, and fasting labs then  Use condoms for 1 pack

## 2016-01-10 ENCOUNTER — Ambulatory Visit (INDEPENDENT_AMBULATORY_CARE_PROVIDER_SITE_OTHER): Payer: BLUE CROSS/BLUE SHIELD | Admitting: Family Medicine

## 2016-01-10 ENCOUNTER — Encounter: Payer: Self-pay | Admitting: Family Medicine

## 2016-01-10 VITALS — BP 120/78 | Temp 98.5°F | Ht 63.0 in | Wt 152.2 lb

## 2016-01-10 DIAGNOSIS — J4521 Mild intermittent asthma with (acute) exacerbation: Secondary | ICD-10-CM

## 2016-01-10 MED ORDER — PREDNISONE 20 MG PO TABS: ORAL_TABLET | ORAL | 0 refills | Status: DC

## 2016-01-10 NOTE — Progress Notes (Deleted)
   Subjective:    Patient ID: Bailey Sullivan, female    DOB: 1980-12-14, 35 y.o.   MRN: [id number]  HPI    Review of Systems     Objective:   Physical Exam        Assessment & Plan:

## 2016-01-10 NOTE — Progress Notes (Signed)
   Subjective:    Patient ID: Bailey Sullivan, female    DOB: 1980-08-24, 35 y.o.   MRN: [id number]  HPI Patient is here today for asthma flare up. Patient is having wheezing, shortness of breath and coughing. Onset 2 days ago. Treatments tried: Inhaler, allergy medicine.  Patient states that she has no other concerns at this time.  Got tight, felt wehezy   Sing inhlaer qtwfour hrs   Generally has to come to the office twice per year for flares of asthma. Usually generated by allergies but occasionally by infection. This time patient thinks due to allergies  Review of Systems No headache, no major weight loss or weight gain, no chest pain no back pain abdominal pain no change in bowel habits complete ROS otherwise negative     Objective:   Physical Exam  Alert vitals stable, NAD. Blood pressure good on repeat. HEENTMild nasal congestion otherwise normal. Lungs substantial expiratory wheezes but no tachypnea. Heart regular rate and rhythm.       Assessment & Plan:  Impression 1 exacerbation asthma fairly significant flare discussed #2 allergic rhinitis suboptimum encouraged to resume Zyrtec plan prednisone taper. Inhaler 2 sprays every 4 hours may use 3 sprays of necessary warning signs discussed. If becomes frequent may need preventive therapy

## 2016-01-23 ENCOUNTER — Other Ambulatory Visit: Payer: Self-pay | Admitting: Family Medicine

## 2016-01-25 ENCOUNTER — Ambulatory Visit (INDEPENDENT_AMBULATORY_CARE_PROVIDER_SITE_OTHER): Payer: BLUE CROSS/BLUE SHIELD | Admitting: Nurse Practitioner

## 2016-01-25 ENCOUNTER — Encounter: Payer: Self-pay | Admitting: Nurse Practitioner

## 2016-01-25 VITALS — BP 128/90 | Temp 98.7°F | Ht 63.0 in | Wt 151.0 lb

## 2016-01-25 DIAGNOSIS — J4521 Mild intermittent asthma with (acute) exacerbation: Secondary | ICD-10-CM

## 2016-01-25 MED ORDER — ALBUTEROL SULFATE HFA 108 (90 BASE) MCG/ACT IN AERS: 2.0000 | INHALATION_SPRAY | RESPIRATORY_TRACT | 5 refills | Status: DC | PRN

## 2016-01-25 MED ORDER — PREDNISONE 20 MG PO TABS: ORAL_TABLET | ORAL | 0 refills | Status: DC

## 2016-01-25 MED ORDER — FLUTICASONE FUROATE-VILANTEROL 100-25 MCG/INH IN AEPB: 1.0000 | INHALATION_SPRAY | Freq: Every day | RESPIRATORY_TRACT | 2 refills | Status: DC

## 2016-01-25 NOTE — Progress Notes (Signed)
Subjective:  Presents for c/o flare up of her asthma. Last seen 01/10/16. Was better until couple of days ago. Began having wheezing, cough and SOB. Used her albuterol 3 x over the past 5 hours. No fever, sore throat, headache or ear pain.   Objective:   BP 128/90   Temp 98.7 F (37.1 C) (Oral)   Ht 5\' 3"  (1.6 m)   Wt 151 lb (68.5 kg)   BMI 26.75 kg/m  NAD. Alert, oriented. TMs clear effusion. Pharynx injected with cloudy PND noted. Neck supple with mild anterior adenopathy. Lungs occasional faint expiratory wheeze. No tachypnea. Normal color. No indication for neb treatment. Heart RRR.   Assessment: Mild intermittent asthma with acute exacerbation  Plan:  Meds ordered this encounter  Medications  . albuterol (PROAIR HFA) 108 (90 Base) MCG/ACT inhaler    Sig: Inhale 2 puffs into the lungs every 4 (four) hours as needed for wheezing or shortness of breath.    Dispense:  8.5 g    Refill:  5    This prescription was filled on 01/23/2016. Any refills authorized will be placed on file.    Order Specific Question:   Supervising Provider    Answer:   Mikey Kirschner [[id number]]  . fluticasone furoate-vilanterol (BREO ELLIPTA) 100-25 MCG/INH AEPB    Sig: Inhale 1 puff into the lungs daily.    Dispense:  1 each    Refill:  2    Order Specific Question:   Supervising Provider    Answer:   Mikey Kirschner [[id number]]  . predniSONE (DELTASONE) 20 MG tablet    Sig: Take 3 tabs qd x 3 days, 2 tabs qd x 3 days, 1 tab qd x 2 days    Dispense:  17 tablet    Refill:  0    Order Specific Question:   Supervising Provider    Answer:   Mikey Kirschner [[id number]]   Restart Prednisone. Add Breo as preventive med. Understands that albuterol is her rescue inhaler.  Return if symptoms worsen or fail to improve.

## 2016-03-12 ENCOUNTER — Ambulatory Visit (INDEPENDENT_AMBULATORY_CARE_PROVIDER_SITE_OTHER): Payer: BLUE CROSS/BLUE SHIELD | Admitting: Adult Health

## 2016-03-12 ENCOUNTER — Encounter: Payer: Self-pay | Admitting: Adult Health

## 2016-03-12 VITALS — BP 120/92 | HR 94 | Ht 63.0 in | Wt 156.0 lb

## 2016-03-12 DIAGNOSIS — N926 Irregular menstruation, unspecified: Secondary | ICD-10-CM | POA: Diagnosis not present

## 2016-03-12 DIAGNOSIS — Z3201 Encounter for pregnancy test, result positive: Secondary | ICD-10-CM

## 2016-03-12 DIAGNOSIS — O09521 Supervision of elderly multigravida, first trimester: Secondary | ICD-10-CM

## 2016-03-12 DIAGNOSIS — Z3490 Encounter for supervision of normal pregnancy, unspecified, unspecified trimester: Secondary | ICD-10-CM | POA: Insufficient documentation

## 2016-03-12 DIAGNOSIS — O3680X0 Pregnancy with inconclusive fetal viability, not applicable or unspecified: Secondary | ICD-10-CM

## 2016-03-12 LAB — POCT URINE PREGNANCY: Preg Test, Ur: POSITIVE — AB

## 2016-03-12 NOTE — Patient Instructions (Signed)
First Trimester of Pregnancy The first trimester of pregnancy is from week 1 until the end of week 12 (months 1 through 3). A week after a sperm fertilizes an egg, the egg will implant on the wall of the uterus. This embryo will begin to develop into a baby. Genes from you and your partner are forming the baby. The female genes determine whether the baby is a boy or a girl. At 6-8 weeks, the eyes and face are formed, and the heartbeat can be seen on ultrasound. At the end of 12 weeks, all the baby's organs are formed.  Now that you are pregnant, you will want to do everything you can to have a healthy baby. Two of the most important things are to get good prenatal care and to follow your health care provider's instructions. Prenatal care is all the medical care you receive before the baby's birth. This care will help prevent, find, and treat any problems during the pregnancy and childbirth. BODY CHANGES Your body goes through many changes during pregnancy. The changes vary from woman to woman.   You may gain or lose a couple of pounds at first.  You may feel sick to your stomach (nauseous) and throw up (vomit). If the vomiting is uncontrollable, call your health care provider.  You may tire easily.  You may develop headaches that can be relieved by medicines approved by your health care provider.  You may urinate more often. Painful urination may mean you have a bladder infection.  You may develop heartburn as a result of your pregnancy.  You may develop constipation because certain hormones are causing the muscles that push waste through your intestines to slow down.  You may develop hemorrhoids or swollen, bulging veins (varicose veins).  Your breasts may begin to grow larger and become tender. Your nipples may stick out more, and the tissue that surrounds them (areola) may become darker.  Your gums may bleed and may be sensitive to brushing and flossing.  Dark spots or blotches (chloasma,  mask of pregnancy) may develop on your face. This will likely fade after the baby is born.  Your menstrual periods will stop.  You may have a loss of appetite.  You may develop cravings for certain kinds of food.  You may have changes in your emotions from day to day, such as being excited to be pregnant or being concerned that something may go wrong with the pregnancy and baby.  You may have more vivid and strange dreams.  You may have changes in your hair. These can include thickening of your hair, rapid growth, and changes in texture. Some women also have hair loss during or after pregnancy, or hair that feels dry or thin. Your hair will most likely return to normal after your baby is born. WHAT TO EXPECT AT YOUR PRENATAL VISITS During a routine prenatal visit:  You will be weighed to make sure you and the baby are growing normally.  Your blood pressure will be taken.  Your abdomen will be measured to track your baby's growth.  The fetal heartbeat will be listened to starting around week 10 or 12 of your pregnancy.  Test results from any previous visits will be discussed. Your health care provider may ask you:  How you are feeling.  If you are feeling the baby move.  If you have had any abnormal symptoms, such as leaking fluid, bleeding, severe headaches, or abdominal cramping.  If you are using any tobacco products,   including cigarettes, chewing tobacco, and electronic cigarettes.  If you have any questions. Other tests that may be performed during your first trimester include:  Blood tests to find your blood type and to check for the presence of any previous infections. They will also be used to check for low iron levels (anemia) and Rh antibodies. Later in the pregnancy, blood tests for diabetes will be done along with other tests if problems develop.  Urine tests to check for infections, diabetes, or protein in the urine.  An ultrasound to confirm the proper growth  and development of the baby.  An amniocentesis to check for possible genetic problems.  Fetal screens for spina bifida and Down syndrome.  You may need other tests to make sure you and the baby are doing well.  HIV (human immunodeficiency virus) testing. Routine prenatal testing includes screening for HIV, unless you choose not to have this test. HOME CARE INSTRUCTIONS  Medicines   Follow your health care provider's instructions regarding medicine use. Specific medicines may be either safe or unsafe to take during pregnancy.  Take your prenatal vitamins as directed.  If you develop constipation, try taking a stool softener if your health care provider approves. Diet   Eat regular, well-balanced meals. Choose a variety of foods, such as meat or vegetable-based protein, fish, milk and low-fat dairy products, vegetables, fruits, and whole grain breads and cereals. Your health care provider will help you determine the amount of weight gain that is right for you.  Avoid raw meat and uncooked cheese. These carry germs that can cause birth defects in the baby.  Eating four or five small meals rather than three large meals a day may help relieve nausea and vomiting. If you start to feel nauseous, eating a few soda crackers can be helpful. Drinking liquids between meals instead of during meals also seems to help nausea and vomiting.  If you develop constipation, eat more high-fiber foods, such as fresh vegetables or fruit and whole grains. Drink enough fluids to keep your urine clear or pale yellow. Activity and Exercise   Exercise only as directed by your health care provider. Exercising will help you:  Control your weight.  Stay in shape.  Be prepared for labor and delivery.  Experiencing pain or cramping in the lower abdomen or low back is a good sign that you should stop exercising. Check with your health care provider before continuing normal exercises.  Try to avoid standing for  long periods of time. Move your legs often if you must stand in one place for a long time.  Avoid heavy lifting.  Wear low-heeled shoes, and practice good posture.  You may continue to have sex unless your health care provider directs you otherwise. Relief of Pain or Discomfort   Wear a good support bra for breast tenderness.   Take warm sitz baths to soothe any pain or discomfort caused by hemorrhoids. Use hemorrhoid cream if your health care provider approves.   Rest with your legs elevated if you have leg cramps or low back pain.  If you develop varicose veins in your legs, wear support hose. Elevate your feet for 15 minutes, 3-4 times a day. Limit salt in your diet. Prenatal Care   Schedule your prenatal visits by the twelfth week of pregnancy. They are usually scheduled monthly at first, then more often in the last 2 months before delivery.  Write down your questions. Take them to your prenatal visits.  Keep all your prenatal  legs, wear support hose. Elevate your feet for 15 minutes, 3-4 times a day. Limit salt in your diet.  Prenatal Care  · Schedule your prenatal visits by the twelfth week of pregnancy. They are usually scheduled monthly at first, then more often in the last 2 months before delivery.  · Write down your questions. Take them to your prenatal visits.  · Keep all your prenatal visits as directed by your health care provider.  Safety  · Wear your seat belt at all times when driving.  · Make a list of emergency phone numbers, including numbers for family, friends, the hospital, and police and fire departments.  General Tips  · Ask your health care provider for a referral to a local prenatal education class. Begin classes no later than at the beginning of month 6 of your pregnancy.  · Ask for help if you have counseling or nutritional needs during pregnancy. Your health care provider can offer advice or refer you to specialists for help with various needs.  · Do not use hot tubs, steam rooms, or saunas.  · Do not douche or use tampons or scented sanitary pads.  · Do not cross your legs for long periods of time.  · Avoid cat litter boxes and soil used by cats. These carry germs that can cause birth defects in the baby and possibly loss of the fetus by miscarriage or stillbirth.   · Avoid all smoking, herbs, alcohol, and medicines not prescribed by your health care provider. Chemicals in these affect the formation and growth of the baby.  · Do not use any tobacco products, including cigarettes, chewing tobacco, and electronic cigarettes. If you need help quitting, ask your health care provider. You may receive counseling support and other resources to help you quit.  · Schedule a dentist appointment. At home, brush your teeth with a soft toothbrush and be gentle when you floss.  SEEK MEDICAL CARE IF:   · You have dizziness.  · You have mild pelvic cramps, pelvic pressure, or nagging pain in the abdominal area.  · You have persistent nausea, vomiting, or diarrhea.  · You have a bad smelling vaginal discharge.  · You have pain with urination.  · You notice increased swelling in your face, hands, legs, or ankles.  SEEK IMMEDIATE MEDICAL CARE IF:   · You have a fever.  · You are leaking fluid from your vagina.  · You have spotting or bleeding from your vagina.  · You have severe abdominal cramping or pain.  · You have rapid weight gain or loss.  · You vomit blood or material that looks like coffee grounds.  · You are exposed to German measles and have never had them.  · You are exposed to fifth disease or chickenpox.  · You develop a severe headache.  · You have shortness of breath.  · You have any kind of trauma, such as from a fall or a car accident.     This information is not intended to replace advice given to you by your health care provider. Make sure you discuss any questions you have with your health care provider.     Document Released: 03/20/2001 Document Revised: 04/16/2014 Document Reviewed: 02/03/2013  Elsevier Interactive Patient Education ©2017 [employer]

## 2016-03-12 NOTE — Progress Notes (Signed)
Subjective:     Patient ID: Bailey Sullivan, female   DOB: 1981/03/06, 35 y.o.   MRN: [id number]  HPI Bailey Sullivan is a 35 year old white female,married in for UPT, had +HPT Friday,has been on lo loestrin and has taken prednisone twice for asthma.   Review of Systems + missed period on the pill Reviewed past medical,surgical, social and family history. Reviewed medications and allergies.     Objective:   Physical Exam BP (!) 120/92 (BP Location: Left Arm, Patient Position: Sitting, Cuff Size: Normal)   Pulse 94   Ht 5\' 3"  (1.6 m)   Wt 156 lb (70.8 kg)   LMP 01/11/2016 (Exact Date)   Breastfeeding? No   BMI 27.63 kg/m  UPT +, about 8+5 weeks by LMP with EDD 10/17/16.Skin warm and dry. Neck: mid line trachea, normal thyroid, good ROM, no lymphadenopathy noted. Lungs: clear to ausculation bilaterally. Cardiovascular: regular rate and rhythm.Abdomen is soft and non tender.PHQ 2 score 1.   Will get [place] in 2 days.  Assessment:     1. Pregnancy examination or test, positive result   2. Pregnancy, unspecified gestational age   19. Encounter to determine fetal viability of pregnancy, single or unspecified fetus   4. Elderly multigravida in first trimester       Plan:   Take prenatal vitamins  Return in 2 days for dating [place] Review handout on first trimester

## 2016-03-14 ENCOUNTER — Other Ambulatory Visit: Payer: BLUE CROSS/BLUE SHIELD

## 2016-03-15 ENCOUNTER — Ambulatory Visit (INDEPENDENT_AMBULATORY_CARE_PROVIDER_SITE_OTHER): Payer: BLUE CROSS/BLUE SHIELD

## 2016-03-15 DIAGNOSIS — Z3A01 Less than 8 weeks gestation of pregnancy: Secondary | ICD-10-CM

## 2016-03-15 DIAGNOSIS — O3491 Maternal care for abnormality of pelvic organ, unspecified, first trimester: Secondary | ICD-10-CM

## 2016-03-15 DIAGNOSIS — O3680X0 Pregnancy with inconclusive fetal viability, not applicable or unspecified: Secondary | ICD-10-CM | POA: Diagnosis not present

## 2016-03-15 NOTE — Progress Notes (Signed)
[place] 6+3 wks,single IUP w/ys,pos fht 114 bpm,normal right ov,simple left corpus luteal cyst,crl 7.0 mm,EDD 11/06/18/18 by ultrasound

## 2016-03-21 ENCOUNTER — Telehealth: Payer: Self-pay | Admitting: *Deleted

## 2016-03-21 NOTE — Telephone Encounter (Signed)
Spoke with pt. Pt is [redacted] weeks pregnant and has a yeast infection. She was wondering if she could use anything OTC. I spoke with JAG and she advised can use Monistat. Pt voiced understanding. [place]

## 2016-04-05 ENCOUNTER — Ambulatory Visit (INDEPENDENT_AMBULATORY_CARE_PROVIDER_SITE_OTHER): Payer: BLUE CROSS/BLUE SHIELD | Admitting: Advanced Practice Midwife

## 2016-04-05 ENCOUNTER — Encounter: Payer: Self-pay | Admitting: Advanced Practice Midwife

## 2016-04-05 VITALS — BP 129/75 | HR 96 | Wt 156.8 lb

## 2016-04-05 DIAGNOSIS — Z23 Encounter for immunization: Secondary | ICD-10-CM

## 2016-04-05 DIAGNOSIS — Z3A10 10 weeks gestation of pregnancy: Secondary | ICD-10-CM

## 2016-04-05 DIAGNOSIS — Z331 Pregnant state, incidental: Secondary | ICD-10-CM | POA: Diagnosis not present

## 2016-04-05 DIAGNOSIS — O09521 Supervision of elderly multigravida, first trimester: Secondary | ICD-10-CM

## 2016-04-05 DIAGNOSIS — Z3481 Encounter for supervision of other normal pregnancy, first trimester: Secondary | ICD-10-CM

## 2016-04-05 DIAGNOSIS — Z131 Encounter for screening for diabetes mellitus: Secondary | ICD-10-CM

## 2016-04-05 DIAGNOSIS — O0991 Supervision of high risk pregnancy, unspecified, first trimester: Secondary | ICD-10-CM

## 2016-04-05 DIAGNOSIS — Z1389 Encounter for screening for other disorder: Secondary | ICD-10-CM

## 2016-04-05 DIAGNOSIS — O34212 Maternal care for vertical scar from previous cesarean delivery: Secondary | ICD-10-CM

## 2016-04-05 LAB — POCT URINALYSIS DIPSTICK
Glucose, UA: NEGATIVE
Ketones, UA: NEGATIVE
Nitrite, UA: NEGATIVE
Protein, UA: NEGATIVE
RBC UA: NEGATIVE

## 2016-04-05 NOTE — Progress Notes (Signed)
Subjective:    Bailey Sullivan is a [id number] [redacted]w[redacted]d being seen today for her first obstetrical visit.  Her obstetrical history is significant for advanced maternal age and 89. Had one CX with 3 VBACs.  Plans another VBAC.  Pregnancy history fully reviewed. She conceived on LoLoestrin.    Vitals:   04/05/16 1422  BP: 129/75  Pulse: 96  Weight: 156 lb 12.8 oz (71.1 kg)    HISTORY: OB History  Gravida Para Term Preterm AB Living  6 4 4  0 1 4  SAB TAB Ectopic Multiple Live Births  1 0 0 0 4    # Outcome Date GA Lbr Len/2nd Weight Sex Delivery Anes PTL Lv  6 Current           5 Term 05/30/14 [redacted]w[redacted]d 14:00 / 00:37 7 lb 3.2 oz (3.265 kg) F VBAC EPI N LIV     Complications: Gestational hypertension     Birth Comments: none  4 Term 01/04/12 [redacted]w[redacted]d 07:18 / 00:14 7 lb 1.8 oz (3.225 kg) M VBAC EPI N LIV     Birth Comments: No problems at birth  69 Term 12/27/09 [redacted]w[redacted]d  6 lb 14 oz (3.118 kg) F VBAC EPI N LIV  2 Term 03/25/07 [redacted]w[redacted]d  6 lb 7 oz (2.92 kg) F CS-LTranv EPI N LIV     Complications: Fetal Intolerance  1 SAB              Past Medical History:  Diagnosis Date  . Allergy   . AMA (advanced maternal age) multigravida 35+ 10/15/2013  . Asthma   . Breast pain, left 10/28/2012   Still breast feeding ?cyst will re check in 1 month if still there will get [place]  . Contraceptive management 10/21/2015  . Fatigue 11/25/2012  . No pertinent past medical history   . Reactive airways dysfunction syndrome    Past Surgical History:  Procedure Laterality Date  . CESAREAN SECTION    . DILATION AND CURETTAGE OF UTERUS    . wisom teeth     Family History  Problem Relation Age of Onset  . Cancer Mother     breast  . Hypertension Mother   . Hyperlipidemia Mother   . Thyroid cancer Mother     had thyroid removed  . Hypertension Father   . Parkinson's disease Father   . Heart attack Maternal Grandfather   . ALS Maternal Grandmother   . Cancer Other     leukemia; dad's paternal aunt  . Cancer  Maternal Aunt     breast  . Dementia Maternal Aunt   . COPD Paternal Uncle   . Heart disease Maternal Aunt      Exam                                      System:     Skin: normal coloration and turgor, no rashes    Neurologic: oriented, normal, normal mood   Extremities: normal strength, tone, and muscle mass   HEENT PERRLA   Mouth/Teeth mucous membranes moist, normal dentition   Neck supple and no masses   Cardiovascular: regular rate and rhythm   Respiratory:  appears well, vitals normal, no respiratory distress, acyanotic   Abdomen: soft, non-tender;  FHR: 150US          Assessment:    Pregnancy: [id number] Patient Active Problem List   Diagnosis Date Noted  .  Pregnancy 03/12/2016  . Elderly multigravida in first trimester 03/12/2016  . Asthma with acute exacerbation 08/11/2015  . History of gestational hypertension 07/13/2014  . AMA (advanced maternal age) multigravida 35+ 10/15/2013  . Fatigue 11/25/2012        Plan:     Initial labs drawn. Continue prenatal vitamins  Problem list reviewed and updated  Reviewed n/v relief measures and warning s/s to report  Reviewed recommended weight gain based on pre-gravid BMI  Encouraged well-balanced diet Genetic Screening discussed Integrated Screen: declined.  Ultrasound discussed; fetal survey: requested.  Return in about 4 weeks (around 05/03/2016) for [place].  CRESENZO-DISHMAN,Thresia Ramanathan 04/05/2016

## 2016-04-05 NOTE — Patient Instructions (Signed)
 First Trimester of Pregnancy The first trimester of pregnancy is from week 1 until the end of week 12 (months 1 through 3). A week after a sperm fertilizes an egg, the egg will implant on the wall of the uterus. This embryo will begin to develop into a baby. Genes from you and your partner are forming the baby. The female genes determine whether the baby is a boy or a girl. At 6-8 weeks, the eyes and face are formed, and the heartbeat can be seen on ultrasound. At the end of 12 weeks, all the baby's organs are formed.  Now that you are pregnant, you will want to do everything you can to have a healthy baby. Two of the most important things are to get good prenatal care and to follow your health care provider's instructions. Prenatal care is all the medical care you receive before the baby's birth. This care will help prevent, find, and treat any problems during the pregnancy and childbirth. BODY CHANGES Your body goes through many changes during pregnancy. The changes vary from woman to woman.   You may gain or lose a couple of pounds at first.  You may feel sick to your stomach (nauseous) and throw up (vomit). If the vomiting is uncontrollable, call your health care provider.  You may tire easily.  You may develop headaches that can be relieved by medicines approved by your health care provider.  You may urinate more often. Painful urination may mean you have a bladder infection.  You may develop heartburn as a result of your pregnancy.  You may develop constipation because certain hormones are causing the muscles that push waste through your intestines to slow down.  You may develop hemorrhoids or swollen, bulging veins (varicose veins).  Your breasts may begin to grow larger and become tender. Your nipples may stick out more, and the tissue that surrounds them (areola) may become darker.  Your gums may bleed and may be sensitive to brushing and flossing.  Dark spots or blotches  (chloasma, mask of pregnancy) may develop on your face. This will likely fade after the baby is born.  Your menstrual periods will stop.  You may have a loss of appetite.  You may develop cravings for certain kinds of food.  You may have changes in your emotions from day to day, such as being excited to be pregnant or being concerned that something may go wrong with the pregnancy and baby.  You may have more vivid and strange dreams.  You may have changes in your hair. These can include thickening of your hair, rapid growth, and changes in texture. Some women also have hair loss during or after pregnancy, or hair that feels dry or thin. Your hair will most likely return to normal after your baby is born. WHAT TO EXPECT AT YOUR PRENATAL VISITS During a routine prenatal visit:  You will be weighed to make sure you and the baby are growing normally.  Your blood pressure will be taken.  Your abdomen will be measured to track your baby's growth.  The fetal heartbeat will be listened to starting around week 10 or 12 of your pregnancy.  Test results from any previous visits will be discussed. Your health care provider may ask you:  How you are feeling.  If you are feeling the baby move.  If you have had any abnormal symptoms, such as leaking fluid, bleeding, severe headaches, or abdominal cramping.  If you have any questions. Other   tests that may be performed during your first trimester include:  Blood tests to find your blood type and to check for the presence of any previous infections. They will also be used to check for low iron levels (anemia) and Rh antibodies. Later in the pregnancy, blood tests for diabetes will be done along with other tests if problems develop.  Urine tests to check for infections, diabetes, or protein in the urine.  An ultrasound to confirm the proper growth and development of the baby.  An amniocentesis to check for possible genetic problems.  Fetal  screens for spina bifida and Down syndrome.  You may need other tests to make sure you and the baby are doing well. HOME CARE INSTRUCTIONS  Medicines  Follow your health care provider's instructions regarding medicine use. Specific medicines may be either safe or unsafe to take during pregnancy.  Take your prenatal vitamins as directed.  If you develop constipation, try taking a stool softener if your health care provider approves. Diet  Eat regular, well-balanced meals. Choose a variety of foods, such as meat or vegetable-based protein, fish, milk and low-fat dairy products, vegetables, fruits, and whole grain breads and cereals. Your health care provider will help you determine the amount of weight gain that is right for you.  Avoid raw meat and uncooked cheese. These carry germs that can cause birth defects in the baby.  Eating four or five small meals rather than three large meals a day may help relieve nausea and vomiting. If you start to feel nauseous, eating a few soda crackers can be helpful. Drinking liquids between meals instead of during meals also seems to help nausea and vomiting.  If you develop constipation, eat more high-fiber foods, such as fresh vegetables or fruit and whole grains. Drink enough fluids to keep your urine clear or pale yellow. Activity and Exercise  Exercise only as directed by your health care provider. Exercising will help you:  Control your weight.  Stay in shape.  Be prepared for labor and delivery.  Experiencing pain or cramping in the lower abdomen or low back is a good sign that you should stop exercising. Check with your health care provider before continuing normal exercises.  Try to avoid standing for long periods of time. Move your legs often if you must stand in one place for a long time.  Avoid heavy lifting.  Wear low-heeled shoes, and practice good posture.  You may continue to have sex unless your health care provider directs you  otherwise. Relief of Pain or Discomfort  Wear a good support bra for breast tenderness.   Take warm sitz baths to soothe any pain or discomfort caused by hemorrhoids. Use hemorrhoid cream if your health care provider approves.   Rest with your legs elevated if you have leg cramps or low back pain.  If you develop varicose veins in your legs, wear support hose. Elevate your feet for 15 minutes, 3-4 times a day. Limit salt in your diet. Prenatal Care  Schedule your prenatal visits by the twelfth week of pregnancy. They are usually scheduled monthly at first, then more often in the last 2 months before delivery.  Write down your questions. Take them to your prenatal visits.  Keep all your prenatal visits as directed by your health care provider. Safety  Wear your seat belt at all times when driving.  Make a list of emergency phone numbers, including numbers for family, friends, the hospital, and police and fire departments. General   Tips  Ask your health care provider for a referral to a local prenatal education class. Begin classes no later than at the beginning of month 6 of your pregnancy.  Ask for help if you have counseling or nutritional needs during pregnancy. Your health care provider can offer advice or refer you to specialists for help with various needs.  Do not use hot tubs, steam rooms, or saunas.  Do not douche or use tampons or scented sanitary pads.  Do not cross your legs for long periods of time.  Avoid cat litter boxes and soil used by cats. These carry germs that can cause birth defects in the baby and possibly loss of the fetus by miscarriage or stillbirth.  Avoid all smoking, herbs, alcohol, and medicines not prescribed by your health care provider. Chemicals in these affect the formation and growth of the baby.  Schedule a dentist appointment. At home, brush your teeth with a soft toothbrush and be gentle when you floss. SEEK MEDICAL CARE IF:   You have  dizziness.  You have mild pelvic cramps, pelvic pressure, or nagging pain in the abdominal area.  You have persistent nausea, vomiting, or diarrhea.  You have a bad smelling vaginal discharge.  You have pain with urination.  You notice increased swelling in your face, hands, legs, or ankles. SEEK IMMEDIATE MEDICAL CARE IF:   You have a fever.  You are leaking fluid from your vagina.  You have spotting or bleeding from your vagina.  You have severe abdominal cramping or pain.  You have rapid weight gain or loss.  You vomit blood or material that looks like coffee grounds.  You are exposed to German measles and have never had them.  You are exposed to fifth disease or chickenpox.  You develop a severe headache.  You have shortness of breath.  You have any kind of trauma, such as from a fall or a car accident. Document Released: 03/20/2001 Document Revised: 08/10/2013 Document Reviewed: 02/03/2013 [employer] Patient Information 2015 [employer]. This information is not intended to replace advice given to you by your health care provider. Make sure you discuss any questions you have with your health care provider.   Nausea & Vomiting  Have saltine crackers or pretzels by your bed and eat a few bites before you raise your head out of bed in the morning  Eat small frequent meals throughout the day instead of large meals  Drink plenty of fluids throughout the day to stay hydrated, just don't drink a lot of fluids with your meals.  This can make your stomach fill up faster making you feel sick  Do not brush your teeth right after you eat  Products with real ginger are good for nausea, like ginger ale and ginger hard candy Make sure it says made with real ginger!  Sucking on sour candy like lemon heads is also good for nausea  If your prenatal vitamins make you nauseated, take them at night so you will sleep through the nausea  Sea Bands  If you feel like you need  medicine for the nausea & vomiting please let us know  If you are unable to keep any fluids or food down please let us know   Constipation  Drink plenty of fluid, preferably water, throughout the day  Eat foods high in fiber such as fruits, vegetables, and grains  Exercise, such as walking, is a good way to keep your bowels regular  Drink warm fluids, especially warm   prune juice, or decaf coffee  Eat a 1/2 cup of real oatmeal (not instant), 1/2 cup applesauce, and 1/2-1 cup warm prune juice every day  If needed, you may take Colace (docusate sodium) stool softener once or twice a day to help keep the stool soft. If you are pregnant, wait until you are out of your first trimester (12-14 weeks of pregnancy)  If you still are having problems with constipation, you may take Miralax once daily as needed to help keep your bowels regular.  If you are pregnant, wait until you are out of your first trimester (12-14 weeks of pregnancy)  Safe Medications in Pregnancy   Acne: Benzoyl Peroxide Salicylic Acid  Backache/Headache: Tylenol: 2 regular strength every 4 hours OR              2 Extra strength every 6 hours  Colds/Coughs/Allergies: Benadryl (alcohol free) 25 mg every 6 hours as needed Breath right strips Claritin Cepacol throat lozenges Chloraseptic throat spray Cold-Eeze- up to three times per day Cough drops, alcohol free Flonase (by prescription only) Guaifenesin Mucinex Robitussin DM (plain only, alcohol free) Saline nasal spray/drops Sudafed (pseudoephedrine) & Actifed ** use only after [redacted] weeks gestation and if you do not have high blood pressure Tylenol Vicks Vaporub Zinc lozenges Zyrtec   Constipation: Colace Ducolax suppositories Fleet enema Glycerin suppositories Metamucil Milk of magnesia Miralax Senokot Smooth move tea  Diarrhea: Kaopectate Imodium A-D  *NO pepto Bismol  Hemorrhoids: Anusol Anusol HC Preparation  H Tucks  Indigestion: Tums Maalox Mylanta Zantac  Pepcid  Insomnia: Benadryl (alcohol free) 25mg every 6 hours as needed Tylenol PM Unisom, no Gelcaps  Leg Cramps: Tums MagGel  Nausea/Vomiting:  Bonine Dramamine Emetrol Ginger extract Sea bands Meclizine  Nausea medication to take during pregnancy:  Unisom (doxylamine succinate 25 mg tablets) Take one tablet daily at bedtime. If symptoms are not adequately controlled, the dose can be increased to a maximum recommended dose of two tablets daily (1/2 tablet in the morning, 1/2 tablet mid-afternoon and one at bedtime). Vitamin B6 100mg tablets. Take one tablet twice a day (up to 200 mg per day).  Skin Rashes: Aveeno products Benadryl cream or 25mg every 6 hours as needed Calamine Lotion 1% cortisone cream  Yeast infection: Gyne-lotrimin 7 Monistat 7   **If taking multiple medications, please check labels to avoid duplicating the same active ingredients **take medication as directed on the label ** Do not exceed 4000 mg of tylenol in 24 hours **Do not take medications that contain aspirin or ibuprofen      

## 2016-04-06 LAB — CBC
Hematocrit: 40.4 % (ref 34.0–46.6)
Hemoglobin: 13.5 g/dL (ref 11.1–15.9)
MCH: 29.3 pg (ref 26.6–33.0)
MCHC: 33.4 g/dL (ref 31.5–35.7)
MCV: 88 fL (ref 79–97)
Platelets: 389 10*3/uL — ABNORMAL HIGH (ref 150–379)
RBC: 4.6 x10E6/uL (ref 3.77–5.28)
RDW: 13.8 % (ref 12.3–15.4)
WBC: 12.2 10*3/uL — ABNORMAL HIGH (ref 3.4–10.8)

## 2016-04-06 LAB — PMP SCREEN PROFILE (10S), URINE
Amphetamine Screen, Ur: NEGATIVE ng/mL
BENZODIAZEPINE SCREEN, URINE: NEGATIVE ng/mL
Barbiturate Screen, Ur: NEGATIVE ng/mL
CREATININE(CRT), U: 17.8 mg/dL — AB (ref 20.0–300.0)
Cannabinoids Ur Ql Scn: NEGATIVE ng/mL
Cocaine(Metab.)Screen, Urine: NEGATIVE ng/mL
Methadone Scn, Ur: NEGATIVE ng/mL
OXYCODONE+OXYMORPHONE UR QL SCN: NEGATIVE ng/mL
Opiate Scrn, Ur: NEGATIVE ng/mL
PCP Scrn, Ur: NEGATIVE ng/mL
PH UR, DRUG SCRN: 6 (ref 4.5–8.9)
Propoxyphene, Screen: NEGATIVE ng/mL

## 2016-04-06 LAB — URINALYSIS, ROUTINE W REFLEX MICROSCOPIC
Bilirubin, UA: NEGATIVE
Glucose, UA: NEGATIVE
Ketones, UA: NEGATIVE
Nitrite, UA: NEGATIVE
PROTEIN UA: NEGATIVE
RBC, UA: NEGATIVE
SPEC GRAV UA: 1.008 (ref 1.005–1.030)
Urobilinogen, Ur: 0.2 mg/dL (ref 0.2–1.0)
pH, UA: 6 (ref 5.0–7.5)

## 2016-04-06 LAB — ABO/RH: Rh Factor: POSITIVE

## 2016-04-06 LAB — MICROSCOPIC EXAMINATION: CASTS: NONE SEEN /LPF

## 2016-04-06 LAB — RPR: RPR Ser Ql: NONREACTIVE

## 2016-04-06 LAB — VARICELLA ZOSTER ANTIBODY, IGG: Varicella zoster IgG: 402 index (ref >165)

## 2016-04-06 LAB — RUBELLA SCREEN: Rubella Antibodies, IGG: 2.15 index (ref >0.99)

## 2016-04-06 LAB — ANTIBODY SCREEN: ANTIBODY SCREEN: NEGATIVE

## 2016-04-06 LAB — HIV ANTIBODY (ROUTINE TESTING W REFLEX): HIV SCREEN 4TH GENERATION: NONREACTIVE

## 2016-04-06 LAB — SPECIFIC GRAVITY (REFLEXED): SPECIFIC GRAVITY: 1.006

## 2016-04-06 LAB — HEPATITIS B SURFACE ANTIGEN: Hepatitis B Surface Ag: NEGATIVE

## 2016-04-07 LAB — GC/CHLAMYDIA PROBE AMP
CHLAMYDIA, DNA PROBE: NEGATIVE
NEISSERIA GONORRHOEAE BY PCR: NEGATIVE

## 2016-04-07 LAB — URINE CULTURE

## 2016-04-09 NOTE — L&D Delivery Note (Signed)
Delivery Note At 5:10 PM a viable and healthy female was delivered via  (Presentation: LOA ;  ).  APGAR:9 ,9 ; weight pending.   Placenta status: intact, .  Cord:  3 vessel cord with the following complications: nuchal easily reducable.    Anesthesia:  Epidural  Episiotomy:  n/a Lacerations:  1st degee Suture Repair: 3.0 vicryl Est. Blood Loss (mL):  168ml  Mom to postpartum.  Baby to Couplet care / Skin to Skin.  Sela Hua 11/08/2016, 5:42 PM

## 2016-05-03 ENCOUNTER — Ambulatory Visit (INDEPENDENT_AMBULATORY_CARE_PROVIDER_SITE_OTHER): Payer: BLUE CROSS/BLUE SHIELD | Admitting: Women's Health

## 2016-05-03 ENCOUNTER — Encounter: Payer: Self-pay | Admitting: Women's Health

## 2016-05-03 VITALS — BP 137/79 | HR 118 | Wt 159.0 lb

## 2016-05-03 DIAGNOSIS — O34219 Maternal care for unspecified type scar from previous cesarean delivery: Secondary | ICD-10-CM

## 2016-05-03 DIAGNOSIS — Z1389 Encounter for screening for other disorder: Secondary | ICD-10-CM

## 2016-05-03 DIAGNOSIS — Z3481 Encounter for supervision of other normal pregnancy, first trimester: Secondary | ICD-10-CM

## 2016-05-03 DIAGNOSIS — Z3A13 13 weeks gestation of pregnancy: Secondary | ICD-10-CM

## 2016-05-03 DIAGNOSIS — Z331 Pregnant state, incidental: Secondary | ICD-10-CM

## 2016-05-03 LAB — POCT URINALYSIS DIPSTICK
Blood, UA: NEGATIVE
Glucose, UA: NEGATIVE
KETONES UA: NEGATIVE
LEUKOCYTES UA: NEGATIVE
Nitrite, UA: NEGATIVE
Protein, UA: NEGATIVE

## 2016-05-03 NOTE — Progress Notes (Signed)
Low-risk OB appointment [id number] [redacted]w[redacted]d Estimated Date of Delivery: 11/05/16 BP 137/79   Pulse (!) 118   Wt 159 lb (72.1 kg)   LMP 01/11/2016 (Exact Date)   BMI 28.17 kg/m   BP, weight, and urine reviewed.  Refer to obstetrical flow sheet for FH & FHR.  No fm yet. Denies cramping, lof, vb, or uti s/s. No complaints. Hasn't started baby ASA yet, to start today. Wants TOLAC, reviewed risks/benefits- consent signed today Reviewed warning s/s to report. Plan:  Continue routine obstetrical care  F/U in 4wks for OB appointment  Declined genetic screening

## 2016-05-03 NOTE — Patient Instructions (Addendum)
Begin taking a 81mg  baby aspirin daily to decrease risk of preeclampsia during pregnancy   Second Trimester of Pregnancy The second trimester is from week 13 through week 28 (months 4 through 6). The second trimester is often a time when you feel your best. Your body has also adjusted to being pregnant, and you begin to feel better physically. Usually, morning sickness has lessened or quit completely, you may have more energy, and you may have an increase in appetite. The second trimester is also a time when the fetus is growing rapidly. At the end of the sixth month, the fetus is about 9 inches long and weighs about 1 pounds. You will likely begin to feel the baby move (quickening) between 18 and 20 weeks of the pregnancy. Body changes during your second trimester Your body continues to go through many changes during your second trimester. The changes vary from woman to woman.  Your weight will continue to increase. You will notice your lower abdomen bulging out.  You may begin to get stretch marks on your hips, abdomen, and breasts.  You may develop headaches that can be relieved by medicines. The medicines should be approved by your health care provider.  You may urinate more often because the fetus is pressing on your bladder.  You may develop or continue to have heartburn as a result of your pregnancy.  You may develop constipation because certain hormones are causing the muscles that push waste through your intestines to slow down.  You may develop hemorrhoids or swollen, bulging veins (varicose veins).  You may have back pain. This is caused by:  Weight gain.  Pregnancy hormones that are relaxing the joints in your pelvis.  A shift in weight and the muscles that support your balance.  Your breasts will continue to grow and they will continue to become tender.  Your gums may bleed and may be sensitive to brushing and flossing.  Dark spots or blotches (chloasma, mask of  pregnancy) may develop on your face. This will likely fade after the baby is born.  A dark line from your belly button to the pubic area (linea nigra) may appear. This will likely fade after the baby is born.  You may have changes in your hair. These can include thickening of your hair, rapid growth, and changes in texture. Some women also have hair loss during or after pregnancy, or hair that feels dry or thin. Your hair will most likely return to normal after your baby is born. What to expect at prenatal visits During a routine prenatal visit:  You will be weighed to make sure you and the fetus are growing normally.  Your blood pressure will be taken.  Your abdomen will be measured to track your baby's growth.  The fetal heartbeat will be listened to.  Any test results from the previous visit will be discussed. Your health care provider may ask you:  How you are feeling.  If you are feeling the baby move.  If you have had any abnormal symptoms, such as leaking fluid, bleeding, severe headaches, or abdominal cramping.  If you are using any tobacco products, including cigarettes, chewing tobacco, and electronic cigarettes.  If you have any questions. Other tests that may be performed during your second trimester include:  Blood tests that check for:  Low iron levels (anemia).  Gestational diabetes (between 24 and 28 weeks).  Rh antibodies. This is to check for a protein on red blood cells (Rh factor).  Urine tests to check for infections, diabetes, or protein in the urine.  An ultrasound to confirm the proper growth and development of the baby.  An amniocentesis to check for possible genetic problems.  Fetal screens for spina bifida and Down syndrome.  HIV (human immunodeficiency virus) testing. Routine prenatal testing includes screening for HIV, unless you choose not to have this test. Follow these instructions at home: Eating and drinking  Continue to eat regular,  healthy meals.  Avoid raw meat, uncooked cheese, cat litter boxes, and soil used by cats. These carry germs that can cause birth defects in the baby.  Take your prenatal vitamins.  Take 1500-2000 mg of calcium daily starting at the 20th week of pregnancy until you deliver your baby.  If you develop constipation:  Take over-the-counter or prescription medicines.  Drink enough fluid to keep your urine clear or pale yellow.  Eat foods that are high in fiber, such as fresh fruits and vegetables, whole grains, and beans.  Limit foods that are high in fat and processed sugars, such as fried and sweet foods. Activity  Exercise only as directed by your health care provider. Experiencing uterine cramps is a good sign to stop exercising.  Avoid heavy lifting, wear low heel shoes, and practice good posture.  Wear your seat belt at all times when driving.  Rest with your legs elevated if you have leg cramps or low back pain.  Wear a good support bra for breast tenderness.  Do not use hot tubs, steam rooms, or saunas. Lifestyle  Avoid all smoking, herbs, alcohol, and unprescribed drugs. These chemicals affect the formation and growth of the baby.  Do not use any products that contain nicotine or tobacco, such as cigarettes and e-cigarettes. If you need help quitting, ask your health care provider.  A sexual relationship may be continued unless your health care provider directs you otherwise. General instructions  Follow your health care provider's instructions regarding medicine use. There are medicines that are either safe or unsafe to take during pregnancy.  Take warm sitz baths to soothe any pain or discomfort caused by hemorrhoids. Use hemorrhoid cream if your health care provider approves.  If you develop varicose veins, wear support hose. Elevate your feet for 15 minutes, 3-4 times a day. Limit salt in your diet.  Visit your dentist if you have not gone yet during your  pregnancy. Use a soft toothbrush to brush your teeth and be gentle when you floss.  Keep all follow-up prenatal visits as told by your health care provider. This is important. Contact a health care provider if:  You have dizziness.  You have mild pelvic cramps, pelvic pressure, or nagging pain in the abdominal area.  You have persistent nausea, vomiting, or diarrhea.  You have a bad smelling vaginal discharge.  You have pain with urination. Get help right away if:  You have a fever.  You are leaking fluid from your vagina.  You have spotting or bleeding from your vagina.  You have severe abdominal cramping or pain.  You have rapid weight gain or weight loss.  You have shortness of breath with chest pain.  You notice sudden or extreme swelling of your face, hands, ankles, feet, or legs.  You have not felt your baby move in over an hour.  You have severe headaches that do not go away with medicine.  You have vision changes. Summary  The second trimester is from week 13 through week 28 (months 4 through  6). It is also a time when the fetus is growing rapidly.  Your body goes through many changes during pregnancy. The changes vary from woman to woman.  Avoid all smoking, herbs, alcohol, and unprescribed drugs. These chemicals affect the formation and growth your baby.  Do not use any tobacco products, such as cigarettes, chewing tobacco, and e-cigarettes. If you need help quitting, ask your health care provider.  Contact your health care provider if you have any questions. Keep all prenatal visits as told by your health care provider. This is important. This information is not intended to replace advice given to you by your health care provider. Make sure you discuss any questions you have with your health care provider. Document Released: 03/20/2001 Document Revised: 09/01/2015 Document Reviewed: 05/27/2012 Elsevier Interactive Patient Education  2017 [employer].

## 2016-05-05 ENCOUNTER — Other Ambulatory Visit: Payer: Self-pay | Admitting: Nurse Practitioner

## 2016-06-01 ENCOUNTER — Encounter: Payer: Self-pay | Admitting: Women's Health

## 2016-06-01 ENCOUNTER — Ambulatory Visit (INDEPENDENT_AMBULATORY_CARE_PROVIDER_SITE_OTHER): Payer: BLUE CROSS/BLUE SHIELD | Admitting: Women's Health

## 2016-06-01 VITALS — BP 120/68 | HR 98 | Wt 162.5 lb

## 2016-06-01 DIAGNOSIS — Z3482 Encounter for supervision of other normal pregnancy, second trimester: Secondary | ICD-10-CM

## 2016-06-01 DIAGNOSIS — Z3A18 18 weeks gestation of pregnancy: Secondary | ICD-10-CM

## 2016-06-01 DIAGNOSIS — Z363 Encounter for antenatal screening for malformations: Secondary | ICD-10-CM

## 2016-06-01 DIAGNOSIS — Z331 Pregnant state, incidental: Secondary | ICD-10-CM

## 2016-06-01 DIAGNOSIS — Z1389 Encounter for screening for other disorder: Secondary | ICD-10-CM

## 2016-06-01 LAB — POCT URINALYSIS DIPSTICK
GLUCOSE UA: NEGATIVE
Ketones, UA: NEGATIVE
Leukocytes, UA: NEGATIVE
NITRITE UA: NEGATIVE
Protein, UA: NEGATIVE
RBC UA: NEGATIVE

## 2016-06-01 NOTE — Progress Notes (Signed)
Low-risk OB appointment [id number] [redacted]w[redacted]d Estimated Date of Delivery: 11/05/16 BP 120/68   Pulse 98   Wt 162 lb 8 oz (73.7 kg)   LMP 01/11/2016 (Exact Date)   BMI 28.79 kg/m   BP, weight, and urine reviewed.  Refer to obstetrical flow sheet for FH & FHR.  Some flutters. Denies cramping, lof, vb, or uti s/s. No complaints. Reviewed warning s/s to report. Plan:  Continue routine obstetrical care  F/U in 2wks for OB appointment and anatomy u/s

## 2016-06-01 NOTE — Patient Instructions (Signed)
Second Trimester of Pregnancy The second trimester is from week 13 through week 28 (months 4 through 6). The second trimester is often a time when you feel your best. Your body has also adjusted to being pregnant, and you begin to feel better physically. Usually, morning sickness has lessened or quit completely, you may have more energy, and you may have an increase in appetite. The second trimester is also a time when the fetus is growing rapidly. At the end of the sixth month, the fetus is about 9 inches long and weighs about 1 pounds. You will likely begin to feel the baby move (quickening) between 18 and 20 weeks of the pregnancy. Body changes during your second trimester Your body continues to go through many changes during your second trimester. The changes vary from woman to woman.  Your weight will continue to increase. You will notice your lower abdomen bulging out.  You may begin to get stretch marks on your hips, abdomen, and breasts.  You may develop headaches that can be relieved by medicines. The medicines should be approved by your health care provider.  You may urinate more often because the fetus is pressing on your bladder.  You may develop or continue to have heartburn as a result of your pregnancy.  You may develop constipation because certain hormones are causing the muscles that push waste through your intestines to slow down.  You may develop hemorrhoids or swollen, bulging veins (varicose veins).  You may have back pain. This is caused by:  Weight gain.  Pregnancy hormones that are relaxing the joints in your pelvis.  A shift in weight and the muscles that support your balance.  Your breasts will continue to grow and they will continue to become tender.  Your gums may bleed and may be sensitive to brushing and flossing.  Dark spots or blotches (chloasma, mask of pregnancy) may develop on your face. This will likely fade after the baby is born.  A dark line  from your belly button to the pubic area (linea nigra) may appear. This will likely fade after the baby is born.  You may have changes in your hair. These can include thickening of your hair, rapid growth, and changes in texture. Some women also have hair loss during or after pregnancy, or hair that feels dry or thin. Your hair will most likely return to normal after your baby is born. What to expect at prenatal visits During a routine prenatal visit:  You will be weighed to make sure you and the fetus are growing normally.  Your blood pressure will be taken.  Your abdomen will be measured to track your baby's growth.  The fetal heartbeat will be listened to.  Any test results from the previous visit will be discussed. Your health care provider may ask you:  How you are feeling.  If you are feeling the baby move.  If you have had any abnormal symptoms, such as leaking fluid, bleeding, severe headaches, or abdominal cramping.  If you are using any tobacco products, including cigarettes, chewing tobacco, and electronic cigarettes.  If you have any questions. Other tests that may be performed during your second trimester include:  Blood tests that check for:  Low iron levels (anemia).  Gestational diabetes (between 24 and 28 weeks).  Rh antibodies. This is to check for a protein on red blood cells (Rh factor).  Urine tests to check for infections, diabetes, or protein in the urine.  An ultrasound to  confirm the proper growth and development of the baby.  An amniocentesis to check for possible genetic problems.  Fetal screens for spina bifida and Down syndrome.  HIV (human immunodeficiency virus) testing. Routine prenatal testing includes screening for HIV, unless you choose not to have this test. Follow these instructions at home: Eating and drinking  Continue to eat regular, healthy meals.  Avoid raw meat, uncooked cheese, cat litter boxes, and soil used by cats. These  carry germs that can cause birth defects in the baby.  Take your prenatal vitamins.  Take 1500-2000 mg of calcium daily starting at the 20th week of pregnancy until you deliver your baby.  If you develop constipation:  Take over-the-counter or prescription medicines.  Drink enough fluid to keep your urine clear or pale yellow.  Eat foods that are high in fiber, such as fresh fruits and vegetables, whole grains, and beans.  Limit foods that are high in fat and processed sugars, such as fried and sweet foods. Activity  Exercise only as directed by your health care provider. Experiencing uterine cramps is a good sign to stop exercising.  Avoid heavy lifting, wear low heel shoes, and practice good posture.  Wear your seat belt at all times when driving.  Rest with your legs elevated if you have leg cramps or low back pain.  Wear a good support bra for breast tenderness.  Do not use hot tubs, steam rooms, or saunas. Lifestyle  Avoid all smoking, herbs, alcohol, and unprescribed drugs. These chemicals affect the formation and growth of the baby.  Do not use any products that contain nicotine or tobacco, such as cigarettes and e-cigarettes. If you need help quitting, ask your health care provider.  A sexual relationship may be continued unless your health care provider directs you otherwise. General instructions  Follow your health care provider's instructions regarding medicine use. There are medicines that are either safe or unsafe to take during pregnancy.  Take warm sitz baths to soothe any pain or discomfort caused by hemorrhoids. Use hemorrhoid cream if your health care provider approves.  If you develop varicose veins, wear support hose. Elevate your feet for 15 minutes, 3-4 times a day. Limit salt in your diet.  Visit your dentist if you have not gone yet during your pregnancy. Use a soft toothbrush to brush your teeth and be gentle when you floss.  Keep all follow-up  prenatal visits as told by your health care provider. This is important. Contact a health care provider if:  You have dizziness.  You have mild pelvic cramps, pelvic pressure, or nagging pain in the abdominal area.  You have persistent nausea, vomiting, or diarrhea.  You have a bad smelling vaginal discharge.  You have pain with urination. Get help right away if:  You have a fever.  You are leaking fluid from your vagina.  You have spotting or bleeding from your vagina.  You have severe abdominal cramping or pain.  You have rapid weight gain or weight loss.  You have shortness of breath with chest pain.  You notice sudden or extreme swelling of your face, hands, ankles, feet, or legs.  You have not felt your baby move in over an hour.  You have severe headaches that do not go away with medicine.  You have vision changes. Summary  The second trimester is from week 13 through week 28 (months 4 through 6). It is also a time when the fetus is growing rapidly.  Your body goes  through many changes during pregnancy. The changes vary from woman to woman.  Avoid all smoking, herbs, alcohol, and unprescribed drugs. These chemicals affect the formation and growth your baby.  Do not use any tobacco products, such as cigarettes, chewing tobacco, and e-cigarettes. If you need help quitting, ask your health care provider.  Contact your health care provider if you have any questions. Keep all prenatal visits as told by your health care provider. This is important. This information is not intended to replace advice given to you by your health care provider. Make sure you discuss any questions you have with your health care provider. Document Released: 03/20/2001 Document Revised: 09/01/2015 Document Reviewed: 05/27/2012 Elsevier Interactive Patient Education  2017 [employer].

## 2016-06-15 ENCOUNTER — Ambulatory Visit (INDEPENDENT_AMBULATORY_CARE_PROVIDER_SITE_OTHER): Payer: BLUE CROSS/BLUE SHIELD

## 2016-06-15 ENCOUNTER — Encounter: Payer: Self-pay | Admitting: Obstetrics & Gynecology

## 2016-06-15 ENCOUNTER — Ambulatory Visit (INDEPENDENT_AMBULATORY_CARE_PROVIDER_SITE_OTHER): Payer: BLUE CROSS/BLUE SHIELD | Admitting: Obstetrics & Gynecology

## 2016-06-15 VITALS — BP 110/68 | HR 82 | Wt 167.0 lb

## 2016-06-15 DIAGNOSIS — O358XX1 Maternal care for other (suspected) fetal abnormality and damage, fetus 1: Secondary | ICD-10-CM

## 2016-06-15 DIAGNOSIS — Z331 Pregnant state, incidental: Secondary | ICD-10-CM

## 2016-06-15 DIAGNOSIS — O283 Abnormal ultrasonic finding on antenatal screening of mother: Secondary | ICD-10-CM

## 2016-06-15 DIAGNOSIS — O09523 Supervision of elderly multigravida, third trimester: Secondary | ICD-10-CM

## 2016-06-15 DIAGNOSIS — Z3A20 20 weeks gestation of pregnancy: Secondary | ICD-10-CM

## 2016-06-15 DIAGNOSIS — Z363 Encounter for antenatal screening for malformations: Secondary | ICD-10-CM

## 2016-06-15 DIAGNOSIS — Z3482 Encounter for supervision of other normal pregnancy, second trimester: Secondary | ICD-10-CM

## 2016-06-15 DIAGNOSIS — Z1389 Encounter for screening for other disorder: Secondary | ICD-10-CM

## 2016-06-15 LAB — POCT URINALYSIS DIPSTICK
Blood, UA: NEGATIVE
GLUCOSE UA: NEGATIVE
Ketones, UA: NEGATIVE
Nitrite, UA: NEGATIVE
Protein, UA: NEGATIVE

## 2016-06-15 NOTE — Progress Notes (Signed)
[id number] [redacted]w[redacted]d Estimated Date of Delivery: 11/05/16  Blood pressure 110/68, pulse 82, weight 167 lb (75.8 kg), last menstrual period 01/11/2016.   BP weight and urine results all reviewed and noted.  Please refer to the obstetrical flow sheet for the fundal height and fetal heart rate documentation:  Patient reports good fetal movement, denies any bleeding and no rupture of membranes symptoms or regular contractions. Patient is without complaints. All questions were answered.  Orders Placed This Encounter  Procedures  . POCT Urinalysis Dipstick    Plan:  Continued routine obstetrical care, sonogram normal except isolated LV EICF, considering cf DNA options  Return in about 4 weeks (around 07/13/2016) for LROB.

## 2016-06-15 NOTE — Progress Notes (Signed)
[place] 08+6 wks,cephalic,cx 6.4 cm,normal ov's bilat,post pl gr 0,svp of fluid 4 cm,LVEICF 1.7 mm,fhr 145 bpm,anatomy complete,efw 304 g

## 2016-06-19 ENCOUNTER — Other Ambulatory Visit: Payer: Self-pay | Admitting: Women's Health

## 2016-06-19 ENCOUNTER — Encounter: Payer: Self-pay | Admitting: Women's Health

## 2016-06-21 ENCOUNTER — Encounter: Payer: Self-pay | Admitting: Women's Health

## 2016-06-21 ENCOUNTER — Other Ambulatory Visit: Payer: Self-pay | Admitting: Women's Health

## 2016-06-21 DIAGNOSIS — O283 Abnormal ultrasonic finding on antenatal screening of mother: Secondary | ICD-10-CM | POA: Insufficient documentation

## 2016-06-21 DIAGNOSIS — O09522 Supervision of elderly multigravida, second trimester: Secondary | ICD-10-CM

## 2016-06-22 ENCOUNTER — Other Ambulatory Visit: Payer: BLUE CROSS/BLUE SHIELD

## 2016-06-22 DIAGNOSIS — O283 Abnormal ultrasonic finding on antenatal screening of mother: Secondary | ICD-10-CM | POA: Diagnosis not present

## 2016-06-22 DIAGNOSIS — O09522 Supervision of elderly multigravida, second trimester: Secondary | ICD-10-CM | POA: Diagnosis not present

## 2016-06-28 LAB — INFORMASEQ(SM) PRENATAL TEST
FETAL FRACTION (%): 11.5
FETAL NUMBER: 1
Gestational Age at Collection: 20.6 weeks
Weight: 167 [lb_av]

## 2016-06-29 ENCOUNTER — Telehealth: Payer: Self-pay | Admitting: Women's Health

## 2016-06-29 NOTE — Telephone Encounter (Signed)
LM w/ normal Infomaseq results.  Roma Schanz, CNM, [facility] 06/29/2016 3:07 PM

## 2016-07-13 ENCOUNTER — Ambulatory Visit (INDEPENDENT_AMBULATORY_CARE_PROVIDER_SITE_OTHER): Payer: BLUE CROSS/BLUE SHIELD | Admitting: Women's Health

## 2016-07-13 ENCOUNTER — Encounter: Payer: Self-pay | Admitting: Women's Health

## 2016-07-13 VITALS — BP 110/60 | HR 76 | Wt 170.0 lb

## 2016-07-13 DIAGNOSIS — Z1389 Encounter for screening for other disorder: Secondary | ICD-10-CM

## 2016-07-13 DIAGNOSIS — Z3482 Encounter for supervision of other normal pregnancy, second trimester: Secondary | ICD-10-CM

## 2016-07-13 DIAGNOSIS — Z331 Pregnant state, incidental: Secondary | ICD-10-CM

## 2016-07-13 LAB — POCT URINALYSIS DIPSTICK
Blood, UA: NEGATIVE
GLUCOSE UA: NEGATIVE
KETONES UA: NEGATIVE
LEUKOCYTES UA: NEGATIVE
Nitrite, UA: NEGATIVE
Protein, UA: NEGATIVE

## 2016-07-13 NOTE — Patient Instructions (Signed)
You will have your sugar test next visit.  Please do not eat or drink anything after midnight the night before you come, not even water.  You will be here for at least two hours.     Call the office [phone]) or go to [facility] if:  You begin to have strong, frequent contractions  Your water breaks.  Sometimes it is a big gush of fluid, sometimes it is just a trickle that keeps getting your panties wet or running down your legs  You have vaginal bleeding.  It is normal to have a small amount of spotting if your cervix was checked.   You don't feel your baby moving like normal.  If you don't, get you something to eat and drink and lay down and focus on feeling your baby move.   If your baby is still not moving like normal, you should call the office or go to [place] [facility] Pregnancy The second trimester is from week 13 through week 28, months 4 through 6. The second trimester is often a time when you feel your best. Your body has also adjusted to being pregnant, and you begin to feel better physically. Usually, morning sickness has lessened or quit completely, you may have more energy, and you may have an increase in appetite. The second trimester is also a time when the fetus is growing rapidly. At the end of the sixth month, the fetus is about 9 inches long and weighs about 1 pounds. You will likely begin to feel the baby move (quickening) between 18 and 20 weeks of the pregnancy. BODY CHANGES Your body goes through many changes during pregnancy. The changes vary from woman to woman.   Your weight will continue to increase. You will notice your lower abdomen bulging out.  You may begin to get stretch marks on your hips, abdomen, and breasts.  You may develop headaches that can be relieved by medicines approved by your health care provider.  You may urinate more often because the fetus is pressing on your bladder.  You may develop or continue to have  heartburn as a result of your pregnancy.  You may develop constipation because certain hormones are causing the muscles that push waste through your intestines to slow down.  You may develop hemorrhoids or swollen, bulging veins (varicose veins).  You may have back pain because of the weight gain and pregnancy hormones relaxing your joints between the bones in your pelvis and as a result of a shift in weight and the muscles that support your balance.  Your breasts will continue to grow and be tender.  Your gums may bleed and may be sensitive to brushing and flossing.  Dark spots or blotches (chloasma, mask of pregnancy) may develop on your face. This will likely fade after the baby is born.  A dark line from your belly button to the pubic area (linea nigra) may appear. This will likely fade after the baby is born.  You may have changes in your hair. These can include thickening of your hair, rapid growth, and changes in texture. Some women also have hair loss during or after pregnancy, or hair that feels dry or thin. Your hair will most likely return to normal after your baby is born. WHAT TO EXPECT AT YOUR PRENATAL VISITS During a routine prenatal visit:  You will be weighed to make sure you and the fetus are growing normally.  Your blood pressure will be taken.  Your abdomen will be measured to track your baby's growth.  The fetal heartbeat will be listened to.  Any test results from the previous visit will be discussed. Your health care provider may ask you:  How you are feeling.  If you are feeling the baby move.  If you have had any abnormal symptoms, such as leaking fluid, bleeding, severe headaches, or abdominal cramping.  If you have any questions. Other tests that may be performed during your second trimester include:  Blood tests that check for:  Low iron levels (anemia).  Gestational diabetes (between 24 and 28 weeks).  Rh antibodies.  Urine tests to check  for infections, diabetes, or protein in the urine.  An ultrasound to confirm the proper growth and development of the baby.  An amniocentesis to check for possible genetic problems.  Fetal screens for spina bifida and Down syndrome. HOME CARE INSTRUCTIONS   Avoid all smoking, herbs, alcohol, and unprescribed drugs. These chemicals affect the formation and growth of the baby.  Follow your health care provider's instructions regarding medicine use. There are medicines that are either safe or unsafe to take during pregnancy.  Exercise only as directed by your health care provider. Experiencing uterine cramps is a good sign to stop exercising.  Continue to eat regular, healthy meals.  Wear a good support bra for breast tenderness.  Do not use hot tubs, steam rooms, or saunas.  Wear your seat belt at all times when driving.  Avoid raw meat, uncooked cheese, cat litter boxes, and soil used by cats. These carry germs that can cause birth defects in the baby.  Take your prenatal vitamins.  Try taking a stool softener (if your health care provider approves) if you develop constipation. Eat more high-fiber foods, such as fresh vegetables or fruit and whole grains. Drink plenty of fluids to keep your urine clear or pale yellow.  Take warm sitz baths to soothe any pain or discomfort caused by hemorrhoids. Use hemorrhoid cream if your health care provider approves.  If you develop varicose veins, wear support hose. Elevate your feet for 15 minutes, 3-4 times a day. Limit salt in your diet.  Avoid heavy lifting, wear low heel shoes, and practice good posture.  Rest with your legs elevated if you have leg cramps or low back pain.  Visit your dentist if you have not gone yet during your pregnancy. Use a soft toothbrush to brush your teeth and be gentle when you floss.  A sexual relationship may be continued unless your health care provider directs you otherwise.  Continue to go to all your  prenatal visits as directed by your health care provider. SEEK MEDICAL CARE IF:   You have dizziness.  You have mild pelvic cramps, pelvic pressure, or nagging pain in the abdominal area.  You have persistent nausea, vomiting, or diarrhea.  You have a bad smelling vaginal discharge.  You have pain with urination. SEEK IMMEDIATE MEDICAL CARE IF:   You have a fever.  You are leaking fluid from your vagina.  You have spotting or bleeding from your vagina.  You have severe abdominal cramping or pain.  You have rapid weight gain or loss.  You have shortness of breath with chest pain.  You notice sudden or extreme swelling of your face, hands, ankles, feet, or legs.  You have not felt your baby move in over an hour.  You have severe headaches that do not go away with medicine.  You have vision changes.  Document Released: 03/20/2001 Document Revised: 03/31/2013 Document Reviewed: 05/27/2012 [employer] Patient Information 2015 [employer]. This information is not intended to replace advice given to you by your health care provider. Make sure you discuss any questions you have with your health care provider.     

## 2016-07-13 NOTE — Progress Notes (Signed)
Low-risk OB appointment [id number] [redacted]w[redacted]d Estimated Date of Delivery: 11/05/16 BP 110/60   Pulse 76   Wt 170 lb (77.1 kg)   LMP 01/11/2016 (Exact Date)   BMI 30.11 kg/m   BP, weight, and urine reviewed.  Refer to obstetrical flow sheet for FH & FHR.  Reports good fm.  Denies regular uc's, lof, vb, or uti s/s. No complaints. Reviewed ptl s/s, fm. Plan:  Continue routine obstetrical care  F/U in 4wks for OB appointment and pn2

## 2016-08-10 ENCOUNTER — Encounter: Payer: Self-pay | Admitting: Obstetrics & Gynecology

## 2016-08-10 ENCOUNTER — Ambulatory Visit (INDEPENDENT_AMBULATORY_CARE_PROVIDER_SITE_OTHER): Payer: BLUE CROSS/BLUE SHIELD | Admitting: Obstetrics & Gynecology

## 2016-08-10 ENCOUNTER — Other Ambulatory Visit: Payer: BLUE CROSS/BLUE SHIELD

## 2016-08-10 VITALS — BP 122/66 | HR 76 | Wt 180.0 lb

## 2016-08-10 DIAGNOSIS — Z3482 Encounter for supervision of other normal pregnancy, second trimester: Secondary | ICD-10-CM

## 2016-08-10 DIAGNOSIS — Z331 Pregnant state, incidental: Secondary | ICD-10-CM

## 2016-08-10 DIAGNOSIS — Z131 Encounter for screening for diabetes mellitus: Secondary | ICD-10-CM | POA: Diagnosis not present

## 2016-08-10 DIAGNOSIS — Z3A27 27 weeks gestation of pregnancy: Secondary | ICD-10-CM | POA: Diagnosis not present

## 2016-08-10 DIAGNOSIS — Z1389 Encounter for screening for other disorder: Secondary | ICD-10-CM

## 2016-08-10 LAB — POCT URINALYSIS DIPSTICK
Blood, UA: NEGATIVE
GLUCOSE UA: NEGATIVE
Ketones, UA: NEGATIVE
Leukocytes, UA: NEGATIVE
Nitrite, UA: NEGATIVE
Protein, UA: NEGATIVE

## 2016-08-10 NOTE — Progress Notes (Signed)
[id number] [redacted]w[redacted]d Estimated Date of Delivery: 11/05/16  Blood pressure 122/66, pulse 76, weight 180 lb (81.6 kg), last menstrual period 01/11/2016.   BP weight and urine results all reviewed and noted.  Please refer to the obstetrical flow sheet for the fundal height and fetal heart rate documentation:  Patient reports good fetal movement, denies any bleeding and no rupture of membranes symptoms or regular contractions. Patient is without complaints. All questions were answered.  Orders Placed This Encounter  Procedures  . POCT urinalysis dipstick    Plan:  Continued routine obstetrical care, PN2 today Recommend prenatal cradle  No Follow-up on file.

## 2016-08-11 LAB — ANTIBODY SCREEN: Antibody Screen: NEGATIVE

## 2016-08-11 LAB — RPR: RPR Ser Ql: NONREACTIVE

## 2016-08-11 LAB — CBC
HEMATOCRIT: 32 % — AB (ref 34.0–46.6)
Hemoglobin: 10.3 g/dL — ABNORMAL LOW (ref 11.1–15.9)
MCH: 27.5 pg (ref 26.6–33.0)
MCHC: 32.2 g/dL (ref 31.5–35.7)
MCV: 85 fL (ref 79–97)
Platelets: 397 10*3/uL — ABNORMAL HIGH (ref 150–379)
RBC: 3.75 x10E6/uL — ABNORMAL LOW (ref 3.77–5.28)
RDW: 13.7 % (ref 12.3–15.4)
WBC: 14.1 10*3/uL — ABNORMAL HIGH (ref 3.4–10.8)

## 2016-08-11 LAB — HIV ANTIBODY (ROUTINE TESTING W REFLEX): HIV Screen 4th Generation wRfx: NONREACTIVE

## 2016-08-11 LAB — GLUCOSE TOLERANCE, 2 HOURS W/ 1HR
Glucose, 1 hour: 135 mg/dL (ref 65–179)
Glucose, 2 hour: 93 mg/dL (ref 65–152)
Glucose, Fasting: 78 mg/dL (ref 65–91)

## 2016-08-13 ENCOUNTER — Other Ambulatory Visit: Payer: Self-pay | Admitting: Women's Health

## 2016-08-13 ENCOUNTER — Telehealth: Payer: Self-pay | Admitting: *Deleted

## 2016-08-13 MED ORDER — FERROUS SULFATE 325 (65 FE) MG PO TABS: 325.0000 mg | ORAL_TABLET | Freq: Two times a day (BID) | ORAL | 3 refills | Status: DC

## 2016-08-13 NOTE — Telephone Encounter (Signed)
LMOVM that prescription for iron was sent to pharmacy since she is anemic and to also continue taking PNV, increase iron rich foods such as green leafy vegetables, beans, nuts. Sugar test also normal. Advised to call back if further questions.

## 2016-08-20 ENCOUNTER — Encounter: Payer: Self-pay | Admitting: Women's Health

## 2016-08-31 ENCOUNTER — Encounter: Payer: Self-pay | Admitting: Women's Health

## 2016-08-31 ENCOUNTER — Ambulatory Visit (INDEPENDENT_AMBULATORY_CARE_PROVIDER_SITE_OTHER): Payer: BLUE CROSS/BLUE SHIELD | Admitting: Women's Health

## 2016-08-31 VITALS — BP 110/70 | HR 92 | Wt 181.0 lb

## 2016-08-31 DIAGNOSIS — Z331 Pregnant state, incidental: Secondary | ICD-10-CM

## 2016-08-31 DIAGNOSIS — Z3483 Encounter for supervision of other normal pregnancy, third trimester: Secondary | ICD-10-CM

## 2016-08-31 DIAGNOSIS — Z1389 Encounter for screening for other disorder: Secondary | ICD-10-CM

## 2016-08-31 LAB — POCT URINALYSIS DIPSTICK
Blood, UA: NEGATIVE
GLUCOSE UA: NEGATIVE
KETONES UA: NEGATIVE
LEUKOCYTES UA: NEGATIVE
Nitrite, UA: NEGATIVE
PROTEIN UA: NEGATIVE

## 2016-08-31 MED ORDER — PANTOPRAZOLE SODIUM 20 MG PO TBEC: 20.0000 mg | DELAYED_RELEASE_TABLET | Freq: Every day | ORAL | 3 refills | Status: DC

## 2016-08-31 NOTE — Patient Instructions (Signed)
Call the office [phone]) or go to [facility] if:  You begin to have strong, frequent contractions  Your water breaks.  Sometimes it is a big gush of fluid, sometimes it is just a trickle that keeps getting your panties wet or running down your legs  You have vaginal bleeding.  It is normal to have a small amount of spotting if your cervix was checked.   You don't feel your baby moving like normal.  If you don't, get you something to eat and drink and lay down and focus on feeling your baby move.  You should feel at least 10 movements in 2 hours.  If you don't, you should call the office or go to [facility].    Tdap Vaccine  It is recommended that you get the Tdap vaccine during the third trimester of EACH pregnancy to help protect your baby from getting pertussis (whooping cough)  27-36 weeks is the BEST time to do this so that you can pass the protection on to your baby. During pregnancy is better than after pregnancy, but if you are unable to get it during pregnancy it will be offered at the hospital.   You can get this vaccine at the health department or your family doctor  Everyone who will be around your baby should also be up-to-date on their vaccines. Adults (who are not pregnant) only need 1 dose of Tdap during adulthood.     Preterm Labor and Birth Information The normal length of a pregnancy is 39-41 weeks. Preterm labor is when labor starts before 37 completed weeks of pregnancy. What are the risk factors for preterm labor? Preterm labor is more likely to occur in women who:  Have certain infections during pregnancy such as a bladder infection, sexually transmitted infection, or infection inside the uterus (chorioamnionitis).  Have a shorter-than-normal cervix.  Have gone into preterm labor before.  Have had surgery on their cervix.  Are younger than age 70 or older than age 58.  Are African American.  Are pregnant with twins or multiple babies (multiple  gestation).  Take street drugs or smoke while pregnant.  Do not gain enough weight while pregnant.  Became pregnant shortly after having been pregnant. What are the symptoms of preterm labor? Symptoms of preterm labor include:  Cramps similar to those that can happen during a menstrual period. The cramps may happen with diarrhea.  Pain in the abdomen or lower back.  Regular uterine contractions that may feel like tightening of the abdomen.  A feeling of increased pressure in the pelvis.  Increased watery or bloody mucus discharge from the vagina.  Water breaking (ruptured amniotic sac). Why is it important to recognize signs of preterm labor? It is important to recognize signs of preterm labor because babies who are born prematurely may not be fully developed. This can put them at an increased risk for:  Long-term (chronic) heart and lung problems.  Difficulty immediately after birth with regulating body systems, including blood sugar, body temperature, heart rate, and breathing rate.  Bleeding in the brain.  Cerebral palsy.  Learning difficulties.  Death. These risks are highest for babies who are born before 46 weeks of pregnancy. How is preterm labor treated? Treatment depends on the length of your pregnancy, your condition, and the health of your baby. It may involve:  Having a stitch (suture) placed in your cervix to prevent your cervix from opening too early (cerclage).  Taking or being given medicines, such as:  Hormone medicines. These may be given early in pregnancy to help support the pregnancy.  Medicine to stop contractions.  Medicines to help mature the baby's lungs. These may be prescribed if the risk of delivery is high.  Medicines to prevent your baby from developing cerebral palsy. If the labor happens before 34 weeks of pregnancy, you may need to stay in the hospital. What should I do if I think I am in preterm labor? If you think that you are  going into preterm labor, call your health care provider right away. How can I prevent preterm labor in future pregnancies? To increase your chance of having a full-term pregnancy:  Do not use any tobacco products, such as cigarettes, chewing tobacco, and e-cigarettes. If you need help quitting, ask your health care provider.  Do not use street drugs or medicines that have not been prescribed to you during your pregnancy.  Talk with your health care provider before taking any herbal supplements, even if you have been taking them regularly.  Make sure you gain a healthy amount of weight during your pregnancy.  Watch for infection. If you think that you might have an infection, get it checked right away.  Make sure to tell your health care provider if you have gone into preterm labor before. This information is not intended to replace advice given to you by your health care provider. Make sure you discuss any questions you have with your health care provider. Document Released: 06/16/2003 Document Revised: 09/06/2015 Document Reviewed: 08/17/2015 Elsevier Interactive Patient Education  2017 [employer].   Heartburn During Pregnancy Heartburn is a type of pain or discomfort that can happen in the throat or chest. It is often described as a burning sensation. Heartburn is common during pregnancy because:  A hormone (progesterone) that is released during pregnancy may relax the valve (lower esophageal sphincter, or LES) that separates the esophagus from the stomach. This allows stomach acid to move up into the esophagus, causing heartburn.  The uterus gets larger and pushes up on the stomach, which pushes more acid into the esophagus. This is especially true in the later stages of pregnancy. Heartburn usually goes away or gets better after giving birth. What are the causes? Heartburn is caused by stomach acid backing up into the esophagus (reflux). Reflux can be triggered by:  Changing  hormone levels.  Large meals.  Certain foods and beverages, such as coffee, chocolate, onions, and peppermint.  Exercise.  Increased stomach acid production. What increases the risk? You are more likely to experience heartburn during pregnancy if you:  Had heartburn prior to becoming pregnant.  Have been pregnant more than once before.  Are overweight or obese. The likelihood that you will get heartburn also increases as you get farther along in your pregnancy, especially during the last trimester. What are the signs or symptoms? Symptoms of this condition include:  Burning pain in the chest or lower throat.  Bitter taste in the mouth.  Coughing.  Problems swallowing.  Vomiting.  Hoarse voice.  Asthma. Symptoms may get worse when you lie down or bend over. Symptoms are often worse at night. How is this diagnosed? This condition is diagnosed based on:  Your medical history.  Your symptoms.  Blood tests to check for a certain type of bacteria associated with heartburn.  Whether taking heartburn medicine relieves your symptoms.  Examination of the stomach and esophagus using a tube with a light and camera on the end (endoscopy). How is this  treated? Treatment varies depending on how severe your symptoms are. Your health care provider may recommend:  Over-the-counter medicines (antacids or acid reducers) for mild heartburn.  Prescription medicines to decrease stomach acid or to protect your stomach lining.  Certain changes in your diet.  Raising the head of your bed so it is higher than the foot of the bed. This helps prevent stomach acid from backing up into the esophagus when you are lying down. Follow these instructions at home: Eating and drinking   Do not drink alcohol during your pregnancy.  Identify foods and beverages that make your symptoms worse, and avoid them.  Beverages that you may want to avoid include:  Coffee and tea (with or without  caffeine).  Energy drinks and sports drinks.  Carbonated drinks or sodas.  Citrus fruit juices.  Foods that you may want to avoid include:  Chocolate and cocoa.  Peppermint and mint flavorings.  Garlic, onions, and horseradish.  Spicy and acidic foods, including peppers, chili powder, curry powder, vinegar, hot sauces, and barbecue sauce.  Citrus fruits, such as oranges, lemons, and limes.  Tomato-based foods, such as red sauce, chili, and salsa.  Fried and fatty foods, such as donuts, french fries, potato chips, and high-fat dressings.  High-fat meats, such as hot dogs, cold cuts, sausage, ham, and bacon.  High-fat dairy items, such as whole milk, butter, and cheese.  Eat small, frequent meals instead of large meals.  Avoid drinking large amounts of liquid with your meals.  Avoid eating meals during the 2-3 hours before bedtime.  Avoid lying down right after you eat.  Do not exercise right after you eat. Medicines   Take over-the-counter and prescription medicines only as told by your health care provider.  Do not take aspirin, ibuprofen, or other NSAIDs unless your health care provider tells you to do that.  You may be instructed to avoid medicines that contain sodium bicarbonate. General instructions   If directed, raise the head of your bed about 6 inches (15 cm) by putting blocks under the legs. Sleeping with more pillows does not effectively relieve heartburn because it only changes the position of your head.  Do not use any products that contain nicotine or tobacco, such as cigarettes and e-cigarettes. If you need help quitting, ask your health care provider.  Wear loose-fitting clothing.  Try to reduce your stress, such as with yoga or meditation. If you need help managing stress, ask your health care provider.  Maintain a healthy weight. If you are overweight, work with your health care provider to safely lose weight.  Keep all follow-up visits as told  by your health care provider. This is important. Contact a health care provider if:  You develop new symptoms.  Your symptoms do not improve with treatment.  You have unexplained weight loss.  You have difficulty swallowing.  You make loud sounds when you breathe (wheeze).  You have a cough that does not go away.  You have frequent heartburn for more than 2 weeks.  You have nausea or vomiting that does not get better with treatment.  You have pain in your abdomen. Get help right away if:  You have severe chest pain that spreads to your arm, neck, or jaw.  You feel sweaty, dizzy, or light-headed.  You have shortness of breath.  You have pain when swallowing.  You vomit, and your vomit looks like blood or coffee grounds.  Your stool is bloody or black. This information is not  intended to replace advice given to you by your health care provider. Make sure you discuss any questions you have with your health care provider. Document Released: 03/23/2000 Document Revised: 12/12/2015 Document Reviewed: 12/12/2015 Elsevier Interactive Patient Education  2017 [employer].

## 2016-08-31 NOTE — Progress Notes (Addendum)
Low-risk OB appointment [id number] Estimated Date of Delivery: 11/05/16 BP 110/70   Pulse 92   Wt 181 lb (82.1 kg)   LMP 01/11/2016 (Exact Date)   BMI 32.06 kg/m   BP, weight, and urine reviewed.  Refer to obstetrical flow sheet for FH & FHR.  Reports good fm.  Denies regular uc's, lof, vb, or uti s/s. Bad reflux that wakes her up at night. Currently taking prilosec. Stop prilosec, will try protonix- rx sent. Avoid eating/drinking before going to bed- gave printed info on heartburn/reflux during pregnancy.  Reviewed ptl s/s, fkc. Recommended Tdap at HD/PCP per CDC guidelines.  Plan:  Continue routine obstetrical care  F/U in 2wks for OB appointment

## 2016-08-31 NOTE — Addendum Note (Signed)
Addended by: Wells Guiles R on: 08/31/2016 10:12 AM   Modules accepted: Orders

## 2016-09-14 ENCOUNTER — Encounter: Payer: Self-pay | Admitting: Obstetrics and Gynecology

## 2016-09-14 ENCOUNTER — Ambulatory Visit (INDEPENDENT_AMBULATORY_CARE_PROVIDER_SITE_OTHER): Payer: BLUE CROSS/BLUE SHIELD | Admitting: Obstetrics and Gynecology

## 2016-09-14 VITALS — BP 130/62 | HR 104 | Wt 184.6 lb

## 2016-09-14 DIAGNOSIS — Z1389 Encounter for screening for other disorder: Secondary | ICD-10-CM

## 2016-09-14 DIAGNOSIS — Z331 Pregnant state, incidental: Secondary | ICD-10-CM

## 2016-09-14 DIAGNOSIS — O320XX0 Maternal care for unstable lie, not applicable or unspecified: Secondary | ICD-10-CM

## 2016-09-14 LAB — POCT URINALYSIS DIPSTICK
GLUCOSE UA: NEGATIVE
KETONES UA: NEGATIVE
Leukocytes, UA: NEGATIVE
Nitrite, UA: NEGATIVE
Protein, UA: NEGATIVE
RBC UA: NEGATIVE

## 2016-09-14 NOTE — Progress Notes (Signed)
Bailey Sullivan is a 36 y.o. female  [id number]  Estimated Date of Delivery: 11/05/16 LROB [redacted]w[redacted]d  Chief Complaint  Patient presents with  . Routine Prenatal Visit   Patient has no acute complaints at this time. Patient reports   good fetal movement;  denies any bleeding , rupture of membranes,or regular contractions.  Blood pressure 130/62, pulse (!) 104, weight 184 lb 9.6 oz (83.7 kg), last menstrual period 01/11/2016.   Urine results:negative refer to the ob flow sheet for FH and FHR, ,                          Physical Examination: General appearance - alert, well appearing, and in no distress                                      Abdomen - FH 34 cm ,                                                         -FHR 145 bpm                                                         soft, nontender                                      Pelvic - examination not indicated                                            Questions were answered. Assessment: LROB [id number] @ [redacted]w[redacted]d                            Unstable LIe -Transverse presentation Plan:  Continued routine obstetrical care  F/u in 2  weeks for routine OB care  U/S AT 36 WK FOR PRESENTATION IF NOT CONVINCED OF VERTEX PRESENTATION  By signing my name below, I, Evelene Croon, attest that this documentation has been prepared under the direction and in the presence of Jonnie Kind, MD . Electronically Signed: Evelene Croon, Scribe. 09/14/2016. 9:58 AM. I personally performed the services described in this documentation, which was SCRIBED in my presence. The recorded information has been reviewed and considered accurate. It has been edited as necessary during review. Jonnie Kind, MD

## 2016-09-27 ENCOUNTER — Ambulatory Visit (INDEPENDENT_AMBULATORY_CARE_PROVIDER_SITE_OTHER): Payer: BLUE CROSS/BLUE SHIELD | Admitting: Women's Health

## 2016-09-27 ENCOUNTER — Encounter: Payer: Self-pay | Admitting: Women's Health

## 2016-09-27 VITALS — BP 120/74 | HR 108 | Temp 98.4°F | Wt 186.0 lb

## 2016-09-27 DIAGNOSIS — Z3483 Encounter for supervision of other normal pregnancy, third trimester: Secondary | ICD-10-CM

## 2016-09-27 DIAGNOSIS — J0110 Acute frontal sinusitis, unspecified: Secondary | ICD-10-CM

## 2016-09-27 DIAGNOSIS — Z331 Pregnant state, incidental: Secondary | ICD-10-CM

## 2016-09-27 DIAGNOSIS — Z1389 Encounter for screening for other disorder: Secondary | ICD-10-CM

## 2016-09-27 LAB — POCT URINALYSIS DIPSTICK
Glucose, UA: NEGATIVE
Ketones, UA: NEGATIVE
Leukocytes, UA: NEGATIVE
Nitrite, UA: NEGATIVE
PROTEIN UA: NEGATIVE
RBC UA: NEGATIVE

## 2016-09-27 MED ORDER — CEFACLOR 500 MG PO CAPS: 500.0000 mg | ORAL_CAPSULE | Freq: Three times a day (TID) | ORAL | 0 refills | Status: DC

## 2016-09-27 NOTE — Patient Instructions (Addendum)
[email or web address]  Call the office [phone]) or go to [facility] if:  You begin to have strong, frequent contractions  Your water breaks.  Sometimes it is a big gush of fluid, sometimes it is just a trickle that keeps getting your panties wet or running down your legs  You have vaginal bleeding.  It is normal to have a small amount of spotting if your cervix was checked.   You don't feel your baby moving like normal.  If you don't, get you something to eat and drink and lay down and focus on feeling your baby move.  You should feel at least 10 movements in 2 hours.  If you don't, you should call the office or go to [place] and Birth Information The normal length of a pregnancy is 39-41 weeks. Preterm labor is when labor starts before 37 completed weeks of pregnancy. What are the risk factors for preterm labor? Preterm labor is more likely to occur in women who:  Have certain infections during pregnancy such as a bladder infection, sexually transmitted infection, or infection inside the uterus (chorioamnionitis).  Have a shorter-than-normal cervix.  Have gone into preterm labor before.  Have had surgery on their cervix.  Are younger than age 65 or older than age 39.  Are African American.  Are pregnant with twins or multiple babies (multiple gestation).  Take street drugs or smoke while pregnant.  Do not gain enough weight while pregnant.  Became pregnant shortly after having been pregnant.  What are the symptoms of preterm labor? Symptoms of preterm labor include:  Cramps similar to those that can happen during a menstrual period. The cramps may happen with diarrhea.  Pain in the abdomen or lower back.  Regular uterine contractions that may feel like tightening of the abdomen.  A feeling of increased pressure in the pelvis.  Increased watery or bloody mucus discharge from the vagina.  Water breaking (ruptured amniotic  sac).  Why is it important to recognize signs of preterm labor? It is important to recognize signs of preterm labor because babies who are born prematurely may not be fully developed. This can put them at an increased risk for:  Long-term (chronic) heart and lung problems.  Difficulty immediately after birth with regulating body systems, including blood sugar, body temperature, heart rate, and breathing rate.  Bleeding in the brain.  Cerebral palsy.  Learning difficulties.  Death.  These risks are highest for babies who are born before 73 weeks of pregnancy. How is preterm labor treated? Treatment depends on the length of your pregnancy, your condition, and the health of your baby. It may involve:  Having a stitch (suture) placed in your cervix to prevent your cervix from opening too early (cerclage).  Taking or being given medicines, such as: ? Hormone medicines. These may be given early in pregnancy to help support the pregnancy. ? Medicine to stop contractions. ? Medicines to help mature the baby's lungs. These may be prescribed if the risk of delivery is high. ? Medicines to prevent your baby from developing cerebral palsy.  If the labor happens before 34 weeks of pregnancy, you may need to stay in the hospital. What should I do if I think I am in preterm labor? If you think that you are going into preterm labor, call your health care provider right away. How can I prevent preterm labor in future pregnancies? To increase your chance of having a full-term pregnancy:  Do not use any tobacco products, such as cigarettes, chewing tobacco, and e-cigarettes. If you need help quitting, ask your health care provider.  Do not use street drugs or medicines that have not been prescribed to you during your pregnancy.  Talk with your health care provider before taking any herbal supplements, even if you have been taking them regularly.  Make sure you gain a healthy amount of weight  during your pregnancy.  Watch for infection. If you think that you might have an infection, get it checked right away.  Make sure to tell your health care provider if you have gone into preterm labor before.  This information is not intended to replace advice given to you by your health care provider. Make sure you discuss any questions you have with your health care provider. Document Released: 06/16/2003 Document Revised: 09/06/2015 Document Reviewed: 08/17/2015 Elsevier Interactive Patient Education  2018 [employer].

## 2016-09-27 NOTE — Progress Notes (Signed)
Low-risk OB appointment [id number] [redacted]w[redacted]d Estimated Date of Delivery: 11/05/16 BP 120/74   Pulse (!) 108   Temp 98.4 F (36.9 C)   Wt 186 lb (84.4 kg)   LMP 01/11/2016 (Exact Date)   BMI 32.95 kg/m   BP, weight, and urine reviewed.  Refer to obstetrical flow sheet for FH & FHR.  Reports good fm.  Denies regular uc's, lof, vb, or uti s/s. Cold last week, now w/ Rt sinus pressure/pain. Denies fevers/chills.  Rt frontal and ethmoid sinus tender to palpation Allergic to amoxicillin and sulfa, discussed w/ [facility], recommends ceclor 500mg  TID x 7d. Pt states she has never had ceclor, can't recall if she's ever had keflex. Discussed ~10% chance of reaction w/ her amoxicillin allergy. If pt notices any reaction- stop immediately- take benadryl and let us know Baby still feels transverse- recommended [email or web address] Reviewed ptl s/s, fkc. Plan:  Continue routine obstetrical care  F/U in 2wks for OB appointment- confirm presentation

## 2016-10-09 ENCOUNTER — Telehealth: Payer: Self-pay | Admitting: *Deleted

## 2016-10-09 NOTE — Telephone Encounter (Signed)
Patient called stating she had diarrhea and some contractions last night but nothing this am. She is not bleeding or leaking and baby is very active. Informed patient that nausea, vomiting and diarrhea can be an early sign of labor. Advised to call or go to [facility] if contractions are consistent for at least an hour at 5 min a part, leaking or bleeding. Encouraged to push fluids, rest and try warm bath along with Immodium if needed for the diarrhea. Verbalized understanding.

## 2016-10-12 ENCOUNTER — Encounter: Payer: Self-pay | Admitting: Women's Health

## 2016-10-12 ENCOUNTER — Ambulatory Visit (INDEPENDENT_AMBULATORY_CARE_PROVIDER_SITE_OTHER): Payer: BLUE CROSS/BLUE SHIELD | Admitting: Women's Health

## 2016-10-12 VITALS — BP 110/60 | HR 80 | Wt 189.3 lb

## 2016-10-12 DIAGNOSIS — Z1389 Encounter for screening for other disorder: Secondary | ICD-10-CM

## 2016-10-12 DIAGNOSIS — Z3483 Encounter for supervision of other normal pregnancy, third trimester: Secondary | ICD-10-CM | POA: Diagnosis not present

## 2016-10-12 DIAGNOSIS — Z331 Pregnant state, incidental: Secondary | ICD-10-CM

## 2016-10-12 DIAGNOSIS — Z3A36 36 weeks gestation of pregnancy: Secondary | ICD-10-CM | POA: Diagnosis not present

## 2016-10-12 LAB — POCT URINALYSIS DIPSTICK
Blood, UA: NEGATIVE
Glucose, UA: NEGATIVE
KETONES UA: NEGATIVE
LEUKOCYTES UA: NEGATIVE
Nitrite, UA: NEGATIVE
Protein, UA: NEGATIVE

## 2016-10-12 NOTE — Progress Notes (Signed)
Low-risk OB appointment [id number] [redacted]w[redacted]d Estimated Date of Delivery: 11/05/16 BP 110/60   Pulse 80   Wt 189 lb 4.8 oz (85.9 kg)   LMP 01/11/2016 (Exact Date)   BMI 33.53 kg/m   BP, weight reviewed. Voided right before coming. Will try again before she leaves.  Refer to obstetrical flow sheet for FH & FHR.  Reports good fm.  Denies regular uc's, lof, vb, or uti s/s. No complaints. Was having a lot of pressure, some mild uc's the other day- thinks maybe baby changed positions, b/c was gone the next day GBS, gc/ct collected SVE per request: 1/th/-2, feels vtx> confirmed vtx by informal transabdominal u/s Reviewed labor s/s, fkc. H/O GTHN, reviewed pre-e s/s Plan:  Continue routine obstetrical care  F/U in 1wk for OB appointment

## 2016-10-12 NOTE — Patient Instructions (Signed)
Call the office ([phone]) or go to [facility] if:  You begin to have strong, frequent contractions  Your water breaks.  Sometimes it is a big gush of fluid, sometimes it is just a trickle that keeps getting your panties wet or running down your legs  You have vaginal bleeding.  It is normal to have a small amount of spotting if your cervix was checked.   You don't feel your baby moving like normal.  If you don't, get you something to eat and drink and lay down and focus on feeling your baby move.  You should feel at least 10 movements in 2 hours.  If you don't, you should call the office or go to [facility].     Braxton Hicks Contractions Contractions of the uterus can occur throughout pregnancy, but they are not always a sign that you are in labor. You may have practice contractions called Braxton Hicks contractions. These false labor contractions are sometimes confused with true labor. What are Braxton Hicks contractions? Braxton Hicks contractions are tightening movements that occur in the muscles of the uterus before labor. Unlike true labor contractions, these contractions do not result in opening (dilation) and thinning of the cervix. Toward the end of pregnancy (32-34 weeks), Braxton Hicks contractions can happen more often and may become stronger. These contractions are sometimes difficult to tell apart from true labor because they can be very uncomfortable. You should not feel embarrassed if you go to the hospital with false labor. Sometimes, the only way to tell if you are in true labor is for your health care provider to look for changes in the cervix. The health care provider will do a physical exam and may monitor your contractions. If you are not in true labor, the exam should show that your cervix is not dilating and your water has not broken. If there are no prenatal problems or other health problems associated with your pregnancy, it is completely safe for you to be sent  home with false labor. You may continue to have Braxton Hicks contractions until you go into true labor. How can I tell the difference between true labor and false labor?  Differences ? False labor ? Contractions last 30-70 seconds.: Contractions are usually shorter and not as strong as true labor contractions. ? Contractions become very regular.: Contractions are usually irregular. ? Discomfort is usually felt in the top of the uterus, and it spreads to the lower abdomen and low back.: Contractions are often felt in the front of the lower abdomen and in the groin. ? Contractions do not go away with walking.: Contractions may go away when you walk around or change positions while lying down. ? Contractions usually become more intense and increase in frequency.: Contractions get weaker and are shorter-lasting as time goes on. ? The cervix dilates and gets thinner.: The cervix usually does not dilate or become thin. Follow these instructions at home:  Take over-the-counter and prescription medicines only as told by your health care provider.  Keep up with your usual exercises and follow other instructions from your health care provider.  Eat and drink lightly if you think you are going into labor.  If Braxton Hicks contractions are making you uncomfortable: ? Change your position from lying down or resting to walking, or change from walking to resting. ? Sit and rest in a tub of warm water. ? Drink enough fluid to keep your urine clear or pale yellow. Dehydration may cause these contractions. ?   Do slow and deep breathing several times an hour.  Keep all follow-up prenatal visits as told by your health care provider. This is important. Contact a health care provider if:  You have a fever.  You have continuous pain in your abdomen. Get help right away if:  Your contractions become stronger, more regular, and closer together.  You have fluid leaking or gushing from your vagina.  You  pass blood-tinged mucus (bloody show).  You have bleeding from your vagina.  You have low back pain that you never had before.  You feel your baby's head pushing down and causing pelvic pressure.  Your baby is not moving inside you as much as it used to. Summary  Contractions that occur before labor are called Braxton Hicks contractions, false labor, or practice contractions.  Braxton Hicks contractions are usually shorter, weaker, farther apart, and less regular than true labor contractions. True labor contractions usually become progressively stronger and regular and they become more frequent.  Manage discomfort from Braxton Hicks contractions by changing position, resting in a warm bath, drinking plenty of water, or practicing deep breathing. This information is not intended to replace advice given to you by your health care provider. Make sure you discuss any questions you have with your health care provider. Document Released: 03/26/2005 Document Revised: 02/13/2016 Document Reviewed: 02/13/2016 Elsevier Interactive Patient Education  2017 [employer]  

## 2016-10-14 LAB — OB RESULTS CONSOLE GBS: GBS: NEGATIVE

## 2016-10-14 LAB — GC/CHLAMYDIA PROBE AMP
Chlamydia trachomatis, NAA: NEGATIVE
Neisseria gonorrhoeae by PCR: NEGATIVE

## 2016-10-14 LAB — STREP GP B NAA+RFLX: STREP GP B NAA+RFLX: NEGATIVE

## 2016-10-16 ENCOUNTER — Encounter: Payer: Self-pay | Admitting: Women's Health

## 2016-10-16 DIAGNOSIS — Z803 Family history of malignant neoplasm of breast: Secondary | ICD-10-CM | POA: Insufficient documentation

## 2016-10-19 ENCOUNTER — Encounter: Payer: Self-pay | Admitting: Obstetrics & Gynecology

## 2016-10-19 ENCOUNTER — Ambulatory Visit (INDEPENDENT_AMBULATORY_CARE_PROVIDER_SITE_OTHER): Payer: BLUE CROSS/BLUE SHIELD | Admitting: Obstetrics & Gynecology

## 2016-10-19 VITALS — BP 118/70 | HR 94 | Wt 190.0 lb

## 2016-10-19 DIAGNOSIS — Z331 Pregnant state, incidental: Secondary | ICD-10-CM

## 2016-10-19 DIAGNOSIS — Z3A37 37 weeks gestation of pregnancy: Secondary | ICD-10-CM

## 2016-10-19 DIAGNOSIS — Z1389 Encounter for screening for other disorder: Secondary | ICD-10-CM

## 2016-10-19 DIAGNOSIS — Z3483 Encounter for supervision of other normal pregnancy, third trimester: Secondary | ICD-10-CM

## 2016-10-19 LAB — POCT URINALYSIS DIPSTICK
GLUCOSE UA: NEGATIVE
Ketones, UA: NEGATIVE
Nitrite, UA: NEGATIVE
Protein, UA: NEGATIVE
RBC UA: NEGATIVE

## 2016-10-19 NOTE — Progress Notes (Signed)
[id number] [redacted]w[redacted]d Estimated Date of Delivery: 11/05/16  Blood pressure 118/70, pulse 94, weight 190 lb (86.2 kg), last menstrual period 01/11/2016.   BP weight and urine results all reviewed and noted.  Please refer to the obstetrical flow sheet for the fundal height and fetal heart rate documentation:  Patient reports good fetal movement, denies any bleeding and no rupture of membranes symptoms or regular contractions. Patient is without complaints. All questions were answered.  Orders Placed This Encounter  Procedures  . POCT urinalysis dipstick    Plan:  Continued routine obstetrical care, vertex by sonogram today, still cannot palpate  Return in about 1 week (around 10/26/2016) for LROB.

## 2016-10-26 ENCOUNTER — Ambulatory Visit (INDEPENDENT_AMBULATORY_CARE_PROVIDER_SITE_OTHER): Payer: BLUE CROSS/BLUE SHIELD | Admitting: Women's Health

## 2016-10-26 ENCOUNTER — Encounter: Payer: Self-pay | Admitting: Women's Health

## 2016-10-26 VITALS — BP 126/64 | HR 108 | Wt 191.0 lb

## 2016-10-26 DIAGNOSIS — Z1389 Encounter for screening for other disorder: Secondary | ICD-10-CM

## 2016-10-26 DIAGNOSIS — Z3A38 38 weeks gestation of pregnancy: Secondary | ICD-10-CM

## 2016-10-26 DIAGNOSIS — Z331 Pregnant state, incidental: Secondary | ICD-10-CM

## 2016-10-26 DIAGNOSIS — Z3483 Encounter for supervision of other normal pregnancy, third trimester: Secondary | ICD-10-CM

## 2016-10-26 LAB — POCT URINALYSIS DIPSTICK
Glucose, UA: NEGATIVE
KETONES UA: NEGATIVE
Nitrite, UA: NEGATIVE
Protein, UA: NEGATIVE
RBC UA: NEGATIVE

## 2016-10-26 MED ORDER — CLINDAMYCIN HCL 300 MG PO CAPS: 300.0000 mg | ORAL_CAPSULE | Freq: Three times a day (TID) | ORAL | 0 refills | Status: DC

## 2016-10-26 NOTE — Progress Notes (Signed)
Low-risk OB appointment [id number] [redacted]w[redacted]d Estimated Date of Delivery: 11/05/16 BP 126/64   Pulse (!) 108   Wt 191 lb (86.6 kg)   LMP 01/11/2016 (Exact Date)   BMI 33.83 kg/m   BP, weight, and urine reviewed.  Refer to obstetrical flow sheet for FH & FHR.  Reports good fm.  Denies regular uc's, lof, vb, or uti s/s. No complaints. SVE per request: 1.5/th/-2, vtx, boil Rt mons x 1.5wks will improve then worsen again, had her husband pop it and did get some drainage out. Still tender. Rx clindamycin 300mg  TID x 7d (allergic to cillins-so did not use keflex) Reviewed labor s/s, fkc. Plan:  Continue routine obstetrical care  F/U in 1wk for OB appointment

## 2016-10-26 NOTE — Patient Instructions (Signed)
Call the office ([phone]) or go to [facility] if:  You begin to have strong, frequent contractions  Your water breaks.  Sometimes it is a big gush of fluid, sometimes it is just a trickle that keeps getting your panties wet or running down your legs  You have vaginal bleeding.  It is normal to have a small amount of spotting if your cervix was checked.   You don't feel your baby moving like normal.  If you don't, get you something to eat and drink and lay down and focus on feeling your baby move.  You should feel at least 10 movements in 2 hours.  If you don't, you should call the office or go to [facility].     Braxton Hicks Contractions Contractions of the uterus can occur throughout pregnancy, but they are not always a sign that you are in labor. You may have practice contractions called Braxton Hicks contractions. These false labor contractions are sometimes confused with true labor. What are Braxton Hicks contractions? Braxton Hicks contractions are tightening movements that occur in the muscles of the uterus before labor. Unlike true labor contractions, these contractions do not result in opening (dilation) and thinning of the cervix. Toward the end of pregnancy (32-34 weeks), Braxton Hicks contractions can happen more often and may become stronger. These contractions are sometimes difficult to tell apart from true labor because they can be very uncomfortable. You should not feel embarrassed if you go to the hospital with false labor. Sometimes, the only way to tell if you are in true labor is for your health care provider to look for changes in the cervix. The health care provider will do a physical exam and may monitor your contractions. If you are not in true labor, the exam should show that your cervix is not dilating and your water has not broken. If there are no prenatal problems or other health problems associated with your pregnancy, it is completely safe for you to be sent  home with false labor. You may continue to have Braxton Hicks contractions until you go into true labor. How can I tell the difference between true labor and false labor?  Differences ? False labor ? Contractions last 30-70 seconds.: Contractions are usually shorter and not as strong as true labor contractions. ? Contractions become very regular.: Contractions are usually irregular. ? Discomfort is usually felt in the top of the uterus, and it spreads to the lower abdomen and low back.: Contractions are often felt in the front of the lower abdomen and in the groin. ? Contractions do not go away with walking.: Contractions may go away when you walk around or change positions while lying down. ? Contractions usually become more intense and increase in frequency.: Contractions get weaker and are shorter-lasting as time goes on. ? The cervix dilates and gets thinner.: The cervix usually does not dilate or become thin. Follow these instructions at home:  Take over-the-counter and prescription medicines only as told by your health care provider.  Keep up with your usual exercises and follow other instructions from your health care provider.  Eat and drink lightly if you think you are going into labor.  If Braxton Hicks contractions are making you uncomfortable: ? Change your position from lying down or resting to walking, or change from walking to resting. ? Sit and rest in a tub of warm water. ? Drink enough fluid to keep your urine clear or pale yellow. Dehydration may cause these contractions. ?   Do slow and deep breathing several times an hour.  Keep all follow-up prenatal visits as told by your health care provider. This is important. Contact a health care provider if:  You have a fever.  You have continuous pain in your abdomen. Get help right away if:  Your contractions become stronger, more regular, and closer together.  You have fluid leaking or gushing from your vagina.  You  pass blood-tinged mucus (bloody show).  You have bleeding from your vagina.  You have low back pain that you never had before.  You feel your baby's head pushing down and causing pelvic pressure.  Your baby is not moving inside you as much as it used to. Summary  Contractions that occur before labor are called Braxton Hicks contractions, false labor, or practice contractions.  Braxton Hicks contractions are usually shorter, weaker, farther apart, and less regular than true labor contractions. True labor contractions usually become progressively stronger and regular and they become more frequent.  Manage discomfort from Braxton Hicks contractions by changing position, resting in a warm bath, drinking plenty of water, or practicing deep breathing. This information is not intended to replace advice given to you by your health care provider. Make sure you discuss any questions you have with your health care provider. Document Released: 03/26/2005 Document Revised: 02/13/2016 Document Reviewed: 02/13/2016 Elsevier Interactive Patient Education  2017 [employer]  

## 2016-11-02 ENCOUNTER — Ambulatory Visit (INDEPENDENT_AMBULATORY_CARE_PROVIDER_SITE_OTHER): Payer: BLUE CROSS/BLUE SHIELD | Admitting: Obstetrics and Gynecology

## 2016-11-02 ENCOUNTER — Encounter: Payer: Self-pay | Admitting: Obstetrics and Gynecology

## 2016-11-02 VITALS — BP 120/72 | HR 102 | Wt 191.6 lb

## 2016-11-02 DIAGNOSIS — Z1389 Encounter for screening for other disorder: Secondary | ICD-10-CM

## 2016-11-02 DIAGNOSIS — O34219 Maternal care for unspecified type scar from previous cesarean delivery: Secondary | ICD-10-CM

## 2016-11-02 DIAGNOSIS — Z3483 Encounter for supervision of other normal pregnancy, third trimester: Secondary | ICD-10-CM

## 2016-11-02 DIAGNOSIS — Z331 Pregnant state, incidental: Secondary | ICD-10-CM

## 2016-11-02 DIAGNOSIS — Z3A39 39 weeks gestation of pregnancy: Secondary | ICD-10-CM

## 2016-11-02 LAB — POCT URINALYSIS DIPSTICK
GLUCOSE UA: NEGATIVE
Ketones, UA: NEGATIVE
Nitrite, UA: NEGATIVE
Protein, UA: NEGATIVE
RBC UA: NEGATIVE

## 2016-11-02 NOTE — Progress Notes (Signed)
[id number] [redacted]w[redacted]d Estimated Date of Delivery: 11/05/16 LROB  Patient reports good fetal movement, denies any bleeding and no rupture of membranes symptoms or regular contractions. Patient complaints: no complaints at this time. Pt would like her cervix checked. Denies wanting a sterilization procedure, but notes that the FOB will have a vasectomy next month. Denies any other symptoms.   Blood pressure 120/72, pulse (!) 102, weight 191 lb 9.6 oz (86.9 kg), last menstrual period 01/11/2016. refer to the ob flow sheet for FH and FHR, also BP, Wt, Urine results:notable for 2+ Leukocytes, otherwise negative.                           Physical Examination:  General appearance - alert, well appearing, and in no distress Abdomen - FH 37 cm                  -FHR 154 soft, nontender, nondistended, no masses or organomegaly Pelvic - normal external genitalia, vulva, vagina, cervix, uterus and adnexa CERVIX: posterior, 2 cm, thick                                         Questions were answered. Assessment: LROB [id number] @ [redacted]w[redacted]d  Plan:  Continued routine obstetrical care  F/u in 1 week for routine OB, NST, and schedule induction  By signing my name below, I, Soijett Blue, attest that this documentation has been prepared under the direction and in the presence of Jonnie Kind, MD. Electronically Signed: [place], ED Scribe. 11/02/16. 9:07 AM.  I personally performed the services described in this documentation, which was SCRIBED in my presence. The recorded information has been reviewed and considered accurate. It has been edited as necessary during review. Jonnie Kind, MD

## 2016-11-03 ENCOUNTER — Inpatient Hospital Stay (HOSPITAL_COMMUNITY)
Admission: AD | Admit: 2016-11-03 | Discharge: 2016-11-04 | Disposition: A | Discharge status: Home / Self Care | Payer: BLUE CROSS/BLUE SHIELD | Source: Ambulatory Visit | Attending: Obstetrics and Gynecology | Admitting: Obstetrics and Gynecology

## 2016-11-03 DIAGNOSIS — O479 False labor, unspecified: Secondary | ICD-10-CM

## 2016-11-03 DIAGNOSIS — Z880 Allergy status to penicillin: Secondary | ICD-10-CM | POA: Diagnosis not present

## 2016-11-03 DIAGNOSIS — O09523 Supervision of elderly multigravida, third trimester: Secondary | ICD-10-CM | POA: Insufficient documentation

## 2016-11-03 DIAGNOSIS — R102 Pelvic and perineal pain: Secondary | ICD-10-CM | POA: Insufficient documentation

## 2016-11-03 DIAGNOSIS — J45909 Unspecified asthma, uncomplicated: Secondary | ICD-10-CM | POA: Insufficient documentation

## 2016-11-03 DIAGNOSIS — Z803 Family history of malignant neoplasm of breast: Secondary | ICD-10-CM | POA: Insufficient documentation

## 2016-11-03 DIAGNOSIS — Z79899 Other long term (current) drug therapy: Secondary | ICD-10-CM | POA: Insufficient documentation

## 2016-11-03 DIAGNOSIS — O99513 Diseases of the respiratory system complicating pregnancy, third trimester: Secondary | ICD-10-CM | POA: Insufficient documentation

## 2016-11-03 DIAGNOSIS — Z882 Allergy status to sulfonamides status: Secondary | ICD-10-CM | POA: Insufficient documentation

## 2016-11-03 DIAGNOSIS — N898 Other specified noninflammatory disorders of vagina: Secondary | ICD-10-CM | POA: Diagnosis not present

## 2016-11-03 DIAGNOSIS — Z3A39 39 weeks gestation of pregnancy: Secondary | ICD-10-CM | POA: Diagnosis not present

## 2016-11-03 DIAGNOSIS — O26893 Other specified pregnancy related conditions, third trimester: Secondary | ICD-10-CM | POA: Insufficient documentation

## 2016-11-03 DIAGNOSIS — Z8249 Family history of ischemic heart disease and other diseases of the circulatory system: Secondary | ICD-10-CM | POA: Insufficient documentation

## 2016-11-03 DIAGNOSIS — R030 Elevated blood-pressure reading, without diagnosis of hypertension: Secondary | ICD-10-CM | POA: Diagnosis not present

## 2016-11-03 DIAGNOSIS — R109 Unspecified abdominal pain: Secondary | ICD-10-CM | POA: Diagnosis present

## 2016-11-03 DIAGNOSIS — Z808 Family history of malignant neoplasm of other organs or systems: Secondary | ICD-10-CM | POA: Diagnosis not present

## 2016-11-03 NOTE — MAU Note (Signed)
Contractions since noon, every 4-5 min apart now. Yesterday, 2 cm in office. History of C/S but wants to VBAC again. No leaking. No bleeding. Baby moving well.

## 2016-11-04 ENCOUNTER — Encounter (HOSPITAL_COMMUNITY): Payer: Self-pay

## 2016-11-04 DIAGNOSIS — O479 False labor, unspecified: Secondary | ICD-10-CM

## 2016-11-04 LAB — COMPREHENSIVE METABOLIC PANEL
ALT: 9 U/L — AB (ref 14–54)
AST: 18 U/L (ref 15–41)
Albumin: 3 g/dL — ABNORMAL LOW (ref 3.5–5.0)
Alkaline Phosphatase: 91 U/L (ref 38–126)
Anion gap: 10 (ref 5–15)
BUN: 7 mg/dL (ref 6–20)
CO2: 18 mmol/L — AB (ref 22–32)
CREATININE: 0.42 mg/dL — AB (ref 0.44–1.00)
Calcium: 9.4 mg/dL (ref 8.9–10.3)
Chloride: 107 mmol/L (ref 101–111)
GFR calc Af Amer: >60 mL/min (ref >60)
GFR calc non Af Amer: >60 mL/min (ref >60)
Glucose, Bld: 83 mg/dL (ref 65–99)
Potassium: 3.9 mmol/L (ref 3.5–5.1)
SODIUM: 135 mmol/L (ref 135–145)
Total Bilirubin: 0.4 mg/dL (ref 0.3–1.2)
Total Protein: 6.7 g/dL (ref 6.5–8.1)

## 2016-11-04 LAB — CBC
HCT: 38.1 % (ref 36.0–46.0)
Hemoglobin: 12.5 g/dL (ref 12.0–15.0)
MCH: 28.2 pg (ref 26.0–34.0)
MCHC: 32.8 g/dL (ref 30.0–36.0)
MCV: 85.8 fL (ref 78.0–100.0)
PLATELETS: 480 10*3/uL — AB (ref 150–400)
RBC: 4.44 MIL/uL (ref 3.87–5.11)
RDW: 17.5 % — ABNORMAL HIGH (ref 11.5–15.5)
WBC: 14.4 10*3/uL — AB (ref 4.0–10.5)

## 2016-11-04 LAB — URINALYSIS, ROUTINE W REFLEX MICROSCOPIC
BILIRUBIN URINE: NEGATIVE
Glucose, UA: NEGATIVE mg/dL
Hgb urine dipstick: NEGATIVE
KETONES UR: NEGATIVE mg/dL
Leukocytes, UA: NEGATIVE
NITRITE: NEGATIVE
Protein, ur: NEGATIVE mg/dL
SPECIFIC GRAVITY, URINE: 1.006 (ref 1.005–1.030)
pH: 7 (ref 5.0–8.0)

## 2016-11-04 LAB — PROTEIN / CREATININE RATIO, URINE: Creatinine, Urine: 37 mg/dL

## 2016-11-04 NOTE — Discharge Instructions (Signed)

## 2016-11-04 NOTE — MAU Provider Note (Signed)
History   [id number]   Chief Complaint  Patient presents with  . Labor Eval    HPI Bailey Sullivan is a 36 y.o. female  [phone] at [redacted]w[redacted]d IUP here with report of contractions that started at noon today.  Reports contractions every 4-5 minutes.  Denies vaginal bleeding or leaking of fluid.  No report of headache, vision changes, or epigastric pain.    Patient's last menstrual period was 01/11/2016 (exact date).  OB History  Gravida Para Term Preterm AB Living  6 4 4  0 1 4  SAB TAB Ectopic Multiple Live Births  1 0 0 0 4    # Outcome Date GA Lbr Len/2nd Weight Sex Delivery Anes PTL Lv  6 Current           5 Term 05/30/14 [redacted]w[redacted]d 14:00 / 00:37 7 lb 3.2 oz (3.265 kg) F VBAC EPI N LIV     Complications: Gestational hypertension     Birth Comments: none  4 Term 01/04/12 [redacted]w[redacted]d 07:18 / 00:14 7 lb 1.8 oz (3.225 kg) M VBAC EPI N LIV     Birth Comments: No problems at birth  66 Term 12/27/09 [redacted]w[redacted]d  6 lb 14 oz (3.118 kg) F VBAC EPI N LIV  2 Term 03/25/07 [redacted]w[redacted]d  6 lb 7 oz (2.92 kg) F CS-LTranv EPI N LIV     Complications: Fetal Intolerance  1 SAB               Past Medical History:  Diagnosis Date  . Allergy   . AMA (advanced maternal age) multigravida 35+ 10/15/2013  . Asthma   . Breast pain, left 10/28/2012   Still breast feeding ?cyst will re check in 1 month if still there will get [place]  . Contraceptive management 10/21/2015  . Fatigue 11/25/2012  . No pertinent past medical history   . Reactive airways dysfunction syndrome (HCC)     Family History  Problem Relation Age of Onset  . Cancer Mother        breast  . Hypertension Mother   . Hyperlipidemia Mother   . Thyroid cancer Mother        had thyroid removed  . Hypertension Father   . Parkinson's disease Father   . Parkinsonism Father   . Heart attack Maternal Grandfather   . ALS Maternal Grandmother   . Cancer Other        leukemia; dad's paternal aunt  . Cancer Maternal Aunt        breast  . Dementia Maternal Aunt   .  COPD Paternal Uncle   . Heart disease Maternal Aunt     Social History   Social History  . Marital status: Married    Spouse name: N/A  . Number of children: N/A  . Years of education: N/A   Social History Main Topics  . Smoking status: Never Smoker  . Smokeless tobacco: Never Used  . Alcohol use No  . Drug use: No  . Sexual activity: Yes    Birth control/ protection: None   Other Topics Concern  . None   Social History Narrative  . None    Allergies  Allergen Reactions  . Amoxicillin Hives  . Sulfa Antibiotics Hives    No current facility-administered medications on file prior to encounter.    Current Outpatient Prescriptions on File Prior to Encounter  Medication Sig Dispense Refill  . acetaminophen (TYLENOL) 325 MG tablet Take 650 mg by mouth every 6 (six) hours  as needed.    Marland Kitchen aspirin EC 81 MG tablet Take 81 mg by mouth daily.    . Cetirizine HCl (ZYRTEC ALLERGY PO) Take by mouth daily.    . ferrous sulfate 325 (65 FE) MG tablet Take 1 tablet (325 mg total) by mouth 2 (two) times daily with a meal. 60 tablet 3  . Prenat-Fe Carbonyl-FA-Omega 3 (ONE-A-DAY WOMENS PRENATAL 1 PO) Take by mouth daily.     Marland Kitchen albuterol (PROAIR HFA) 108 (90 Base) MCG/ACT inhaler Inhale 2 puffs into the lungs every 4 (four) hours as needed for wheezing or shortness of breath. 8.5 g 5  . BREO ELLIPTA 100-25 MCG/INH AEPB INHALE ONE PUFF INTO THE LUNGS DAILY. 60 each 5     Review of Systems  Eyes: Negative for visual disturbance.  Gastrointestinal: Negative for abdominal pain, nausea and vomiting.  Genitourinary: Positive for pelvic pain (contractions). Negative for vaginal bleeding and vaginal discharge.  Neurological: Negative for headaches.  All other systems reviewed and are negative.    Physical Exam   Vitals:   11/04/16 0046 11/04/16 0101 11/04/16 0116 11/04/16 0131  BP: 135/85 130/83 128/87 120/82  Pulse: 91 100 99 94  Resp:      Temp:      TempSrc:      SpO2:       Weight:      Height:        Physical Exam  Constitutional: She is oriented to person, place, and time. She appears well-developed and well-nourished.  HENT:  Head: Normocephalic.  Neck: Normal range of motion. Neck supple.  Cardiovascular: Normal rate, regular rhythm and normal heart sounds.   Respiratory: Effort normal and breath sounds normal. No respiratory distress.  GI: Soft. There is no tenderness.  Genitourinary: No bleeding in the vagina. Vaginal discharge (mucusy) found.  Musculoskeletal: Normal range of motion. She exhibits no edema.  Neurological: She is alert and oriented to person, place, and time.  Skin: Skin is warm and dry.   Dilation: 2 Effacement (%): 50 Cervical Position: [place]: -2 Presentation: Vertex Exam by:: Reina Fuse, CNM  FHR 121, +accels, mod variability, no decel [place]oco - 2-5  MAU Course  Procedures  MDM Results for orders placed or performed during the hospital encounter of 11/03/16 (from the past 24 hour(s))  Urinalysis, Routine w reflex microscopic     Status: Abnormal   Collection Time: 11/03/16 11:58 PM  Result Value Ref Range   Color, Urine STRAW (A) YELLOW   APPearance CLEAR CLEAR   Specific Gravity, Urine 1.006 1.005 - 1.030   pH 7.0 5.0 - 8.0   Glucose, UA NEGATIVE NEGATIVE mg/dL   Hgb urine dipstick NEGATIVE NEGATIVE   Bilirubin Urine NEGATIVE NEGATIVE   Ketones, ur NEGATIVE NEGATIVE mg/dL   Protein, ur NEGATIVE NEGATIVE mg/dL   Nitrite NEGATIVE NEGATIVE   Leukocytes, UA NEGATIVE NEGATIVE  Protein / creatinine ratio, urine     Status: None   Collection Time: 11/03/16 11:58 PM  Result Value Ref Range   Creatinine, Urine 37.00 mg/dL   Total Protein, Urine <6 mg/dL   Protein Creatinine Ratio        0.00 - 0.15 mg/mg[Cre]  CBC     Status: Abnormal   Collection Time: 11/04/16 12:50 AM  Result Value Ref Range   WBC 14.4 (H) 4.0 - 10.5 K/uL   RBC 4.44 3.87 - 5.11 MIL/uL   Hemoglobin 12.5 12.0 - 15.0 g/dL   HCT 38.1 36.0 -  46.0 %  MCV 85.8 78.0 - 100.0 fL   MCH 28.2 26.0 - 34.0 pg   MCHC 32.8 30.0 - 36.0 g/dL   RDW 17.5 (H) 11.5 - 15.5 %   Platelets 480 (H) 150 - 400 K/uL  Comprehensive metabolic panel     Status: Abnormal   Collection Time: 11/04/16 12:50 AM  Result Value Ref Range   Sodium 135 135 - 145 mmol/L   Potassium 3.9 3.5 - 5.1 mmol/L   Chloride 107 101 - 111 mmol/L   CO2 18 (L) 22 - 32 mmol/L   Glucose, Bld 83 65 - 99 mg/dL   BUN 7 6 - 20 mg/dL   Creatinine, Ser 0.42 (L) 0.44 - 1.00 mg/dL   Calcium 9.4 8.9 - 10.3 mg/dL   Total Protein 6.7 6.5 - 8.1 g/dL   Albumin 3.0 (L) 3.5 - 5.0 g/dL   AST 18 15 - 41 U/L   ALT 9 (L) 14 - 54 U/L   Alkaline Phosphatase 91 38 - 126 U/L   Total Bilirubin 0.4 0.3 - 1.2 mg/dL   GFR calc non Af Amer >60 >60 mL/min   GFR calc Af Amer >60 >60 mL/min   Anion gap 10 5 - 15     Assessment and Plan  36 y.o. [id number] at 106w6d IUP  Elevated Blood Pressure x 1 - asymptomatic Reactive NST  Plan: Discharge home Return for headache, vision changes, or epigastric pain Keep scheduled appointment Gwen Pounds, CNM 11/04/2016 2:12 AM

## 2016-11-05 ENCOUNTER — Telehealth (HOSPITAL_COMMUNITY): Payer: Self-pay | Admitting: *Deleted

## 2016-11-05 ENCOUNTER — Ambulatory Visit (INDEPENDENT_AMBULATORY_CARE_PROVIDER_SITE_OTHER): Payer: BLUE CROSS/BLUE SHIELD | Admitting: Women's Health

## 2016-11-05 ENCOUNTER — Encounter: Payer: Self-pay | Admitting: Women's Health

## 2016-11-05 ENCOUNTER — Encounter (HOSPITAL_COMMUNITY): Payer: Self-pay | Admitting: *Deleted

## 2016-11-05 VITALS — BP 118/70 | HR 92 | Wt 192.5 lb

## 2016-11-05 DIAGNOSIS — Z3483 Encounter for supervision of other normal pregnancy, third trimester: Secondary | ICD-10-CM

## 2016-11-05 DIAGNOSIS — Z3A40 40 weeks gestation of pregnancy: Secondary | ICD-10-CM

## 2016-11-05 NOTE — Treatment Plan (Signed)
   Induction Assessment Scheduling Form: [facility]ax to [facility]:  [phone]  Bailey Sullivan                                                                                   DOB:  04-20-1980                                                            MRN:  [id number]                                                                     Phone #:   [phone]                         Provider:  [facility]  GP:  [id number]                                                            Estimated Date of Delivery: 11/05/16  Dating Criteria: 6wk u/s    Medical Indications for induction:  postdates Admission Date/Time:  8/6 @ 0730 Gestational age on admission:  54.0   Filed Weights   11/05/16 1018  Weight: 192 lb 8 oz (87.3 kg)   HIV:   neg GBS:  neg  2/50/-2   Method of induction(proposed):  pitocin   Scheduling Provider Signature:  Tawnya Crook, CNM                                            Today's Date:  11/05/2016

## 2016-11-05 NOTE — Patient Instructions (Signed)
Your induction is scheduled for Mon 8/6 @ 7:30am. Go to [facility] hospital, Maternity Admissions Unit (Emergency) entrance and let them know you are there to be induced. They will send someone from Labor & Delivery to come get you.     Call the office [phone]) or go to [facility] if:  You begin to have strong, frequent contractions  Your water breaks.  Sometimes it is a big gush of fluid, sometimes it is just a trickle that keeps getting your panties wet or running down your legs  You have vaginal bleeding.  It is normal to have a small amount of spotting if your cervix was checked.   You don't feel your baby moving like normal.  If you don't, get you something to eat and drink and lay down and focus on feeling your baby move.  You should feel at least 10 movements in 2 hours.  If you don't, you should call the office or go to [facility].    Call the office [phone]) or go to [facility] hospital for these signs of pre-eclampsia:  Severe headache that does not go away with Tylenol  Visual changes- seeing spots, double, blurred vision  Pain under your right breast or upper abdomen that does not go away with Tums or heartburn medicine  Nausea and/or vomiting  Severe swelling in your hands, feet, and face     Braxton Hicks Contractions Contractions of the uterus can occur throughout pregnancy, but they are not always a sign that you are in labor. You may have practice contractions called Braxton Hicks contractions. These false labor contractions are sometimes confused with true labor. What are [place] contractions? Braxton Hicks contractions are tightening movements that occur in the muscles of the uterus before labor. Unlike true labor contractions, these contractions do not result in opening (dilation) and thinning of the cervix. Toward the end of pregnancy (32-34 weeks), Braxton Hicks contractions can happen more often and may become stronger. These contractions are  sometimes difficult to tell apart from true labor because they can be very uncomfortable. You should not feel embarrassed if you go to the hospital with false labor. Sometimes, the only way to tell if you are in true labor is for your health care provider to look for changes in the cervix. The health care provider will do a physical exam and may monitor your contractions. If you are not in true labor, the exam should show that your cervix is not dilating and your water has not broken. If there are no prenatal problems or other health problems associated with your pregnancy, it is completely safe for you to be sent home with false labor. You may continue to have Braxton Hicks contractions until you go into true labor. How can I tell the difference between true labor and false labor?  Differences ? False labor ? Contractions last 30-70 seconds.: Contractions are usually shorter and not as strong as true labor contractions. ? Contractions become very regular.: Contractions are usually irregular. ? Discomfort is usually felt in the top of the uterus, and it spreads to the lower abdomen and low back.: Contractions are often felt in the front of the lower abdomen and in the groin. ? Contractions do not go away with walking.: Contractions may go away when you walk around or change positions while lying down. ? Contractions usually become more intense and increase in frequency.: Contractions get weaker and are shorter-lasting as time goes on. ? The cervix dilates and gets  thinner.: The cervix usually does not dilate or become thin. Follow these instructions at home:  Take over-the-counter and prescription medicines only as told by your health care provider.  Keep up with your usual exercises and follow other instructions from your health care provider.  Eat and drink lightly if you think you are going into labor.  If Braxton Hicks contractions are making you uncomfortable: ? Change your position from  lying down or resting to walking, or change from walking to resting. ? Sit and rest in a tub of warm water. ? Drink enough fluid to keep your urine clear or pale yellow. Dehydration may cause these contractions. ? Do slow and deep breathing several times an hour.  Keep all follow-up prenatal visits as told by your health care provider. This is important. Contact a health care provider if:  You have a fever.  You have continuous pain in your abdomen. Get help right away if:  Your contractions become stronger, more regular, and closer together.  You have fluid leaking or gushing from your vagina.  You pass blood-tinged mucus (bloody show).  You have bleeding from your vagina.  You have low back pain that you never had before.  You feel your baby's head pushing down and causing pelvic pressure.  Your baby is not moving inside you as much as it used to. Summary  Contractions that occur before labor are called Braxton Hicks contractions, false labor, or practice contractions.  Braxton Hicks contractions are usually shorter, weaker, farther apart, and less regular than true labor contractions. True labor contractions usually become progressively stronger and regular and they become more frequent.  Manage discomfort from [facility] contractions by changing position, resting in a warm bath, drinking plenty of water, or practicing deep breathing. This information is not intended to replace advice given to you by your health care provider. Make sure you discuss any questions you have with your health care provider. Document Released: 03/26/2005 Document Revised: 02/13/2016 Document Reviewed: 02/13/2016 Elsevier Interactive Patient Education  2017 [employer].

## 2016-11-05 NOTE — Progress Notes (Signed)
Low-risk OB appointment [id number] [redacted]w[redacted]d Estimated Date of Delivery: 11/05/16 BP 118/70   Pulse 92   Wt 192 lb 8 oz (87.3 kg)   LMP 01/11/2016 (Exact Date)   BMI 34.10 kg/m   BP, weight reviewed. Unable to void, will try again before she leaves.  Refer to obstetrical flow sheet for FH & FHR.  Reports good fm.  Denies regular uc's, lof, vb, or uti s/s. No complaints. Went to [facility] early Sun am thinking she was in labor, SVE 2/50/-2 and uc's stalled. Had elevated bp x 1 per note, then normal. Has h/o GHTN, so here today for bp check. Denies ha, visual changes, ruq/epigastric pain, n/v.   BP great today Declines SVE Reviewed labor s/s, pre-e s/s, fkc. Plan:  Continue routine obstetrical care  F/U on Friday as scheduled for OB appointment and NST d/t >40wks IOL for postdates scheduled today for Mon 8/6 @ 0730 if needed, IOL form sent and orders placed

## 2016-11-05 NOTE — Telephone Encounter (Signed)
Preadmission screen  

## 2016-11-08 ENCOUNTER — Encounter (HOSPITAL_COMMUNITY): Payer: Self-pay | Admitting: *Deleted

## 2016-11-08 ENCOUNTER — Inpatient Hospital Stay (HOSPITAL_COMMUNITY): Payer: BLUE CROSS/BLUE SHIELD | Admitting: Anesthesiology

## 2016-11-08 ENCOUNTER — Inpatient Hospital Stay (HOSPITAL_COMMUNITY)
Admission: AD | Admit: 2016-11-08 | Discharge: 2016-11-09 | DRG: 775 | Disposition: A | Discharge status: Home / Self Care | Payer: BLUE CROSS/BLUE SHIELD | Source: Ambulatory Visit | Attending: Obstetrics & Gynecology | Admitting: Obstetrics & Gynecology

## 2016-11-08 DIAGNOSIS — O480 Post-term pregnancy: Principal | ICD-10-CM | POA: Diagnosis present

## 2016-11-08 DIAGNOSIS — Z3A40 40 weeks gestation of pregnancy: Secondary | ICD-10-CM | POA: Diagnosis not present

## 2016-11-08 DIAGNOSIS — O34211 Maternal care for low transverse scar from previous cesarean delivery: Secondary | ICD-10-CM | POA: Diagnosis present

## 2016-11-08 DIAGNOSIS — O9952 Diseases of the respiratory system complicating childbirth: Secondary | ICD-10-CM | POA: Diagnosis not present

## 2016-11-08 DIAGNOSIS — Z3483 Encounter for supervision of other normal pregnancy, third trimester: Secondary | ICD-10-CM

## 2016-11-08 DIAGNOSIS — O34219 Maternal care for unspecified type scar from previous cesarean delivery: Secondary | ICD-10-CM | POA: Diagnosis present

## 2016-11-08 DIAGNOSIS — J45901 Unspecified asthma with (acute) exacerbation: Secondary | ICD-10-CM | POA: Diagnosis present

## 2016-11-08 DIAGNOSIS — O09529 Supervision of elderly multigravida, unspecified trimester: Secondary | ICD-10-CM

## 2016-11-08 DIAGNOSIS — Z880 Allergy status to penicillin: Secondary | ICD-10-CM | POA: Diagnosis not present

## 2016-11-08 LAB — CBC
HCT: 41 % (ref 36.0–46.0)
Hemoglobin: 13.5 g/dL (ref 12.0–15.0)
MCH: 28.6 pg (ref 26.0–34.0)
MCHC: 32.9 g/dL (ref 30.0–36.0)
MCV: 86.9 fL (ref 78.0–100.0)
PLATELETS: 484 10*3/uL — AB (ref 150–400)
RBC: 4.72 MIL/uL (ref 3.87–5.11)
RDW: 17.1 % — AB (ref 11.5–15.5)
WBC: 16.5 10*3/uL — AB (ref 4.0–10.5)

## 2016-11-08 LAB — TYPE AND SCREEN
ABO/RH(D): A POS
Antibody Screen: NEGATIVE

## 2016-11-08 MED ORDER — BENZOCAINE-MENTHOL 20-0.5 % EX AERO
1.0000 "application " | INHALATION_SPRAY | CUTANEOUS | Status: DC | PRN
  Filled 2016-11-08: qty 56

## 2016-11-08 MED ORDER — OXYTOCIN 40 UNITS IN LACTATED RINGERS INFUSION - SIMPLE MED
1.0000 m[IU]/min | INTRAVENOUS | Status: DC
  Filled 2016-11-08: qty 1000

## 2016-11-08 MED ORDER — DIBUCAINE 1 % RE OINT: 1.0000 "application " | TOPICAL_OINTMENT | RECTAL | Status: DC | PRN

## 2016-11-08 MED ORDER — OXYCODONE-ACETAMINOPHEN 5-325 MG PO TABS: 2.0000 | ORAL_TABLET | ORAL | Status: DC | PRN

## 2016-11-08 MED ORDER — SIMETHICONE 80 MG PO CHEW: 80.0000 mg | CHEWABLE_TABLET | ORAL | Status: DC | PRN

## 2016-11-08 MED ORDER — ONDANSETRON HCL 4 MG/2ML IJ SOLN: 4.0000 mg | Freq: Four times a day (QID) | INTRAMUSCULAR | Status: DC | PRN

## 2016-11-08 MED ORDER — ZOLPIDEM TARTRATE 5 MG PO TABS: 5.0000 mg | ORAL_TABLET | Freq: Every evening | ORAL | Status: DC | PRN

## 2016-11-08 MED ORDER — EPHEDRINE 5 MG/ML INJ
10.0000 mg | INTRAVENOUS | Status: DC | PRN
  Filled 2016-11-08: qty 2

## 2016-11-08 MED ORDER — ONDANSETRON HCL 4 MG PO TABS: 4.0000 mg | ORAL_TABLET | ORAL | Status: DC | PRN

## 2016-11-08 MED ORDER — IBUPROFEN 600 MG PO TABS
600.0000 mg | ORAL_TABLET | Freq: Four times a day (QID) | ORAL | Status: DC
  Administered 2016-11-08 – 2016-11-09 (×5): 600 mg via ORAL
  Filled 2016-11-08 (×5): qty 1

## 2016-11-08 MED ORDER — OXYTOCIN 40 UNITS IN LACTATED RINGERS INFUSION - SIMPLE MED: 2.5000 [IU]/h | INTRAVENOUS | Status: DC

## 2016-11-08 MED ORDER — OXYTOCIN BOLUS FROM INFUSION
500.0000 mL | Freq: Once | INTRAVENOUS | Status: AC
  Administered 2016-11-08: 500 mL via INTRAVENOUS

## 2016-11-08 MED ORDER — DIPHENHYDRAMINE HCL 50 MG/ML IJ SOLN: 12.5000 mg | INTRAMUSCULAR | Status: DC | PRN

## 2016-11-08 MED ORDER — SOD CITRATE-CITRIC ACID 500-334 MG/5ML PO SOLN: 30.0000 mL | ORAL | Status: DC | PRN

## 2016-11-08 MED ORDER — ACETAMINOPHEN 325 MG PO TABS: 650.0000 mg | ORAL_TABLET | ORAL | Status: DC | PRN

## 2016-11-08 MED ORDER — LACTATED RINGERS IV SOLN
INTRAVENOUS | Status: DC
  Administered 2016-11-08: 16:00:00 via INTRAVENOUS

## 2016-11-08 MED ORDER — ONDANSETRON HCL 4 MG/2ML IJ SOLN: 4.0000 mg | INTRAMUSCULAR | Status: DC | PRN

## 2016-11-08 MED ORDER — LACTATED RINGERS IV SOLN
500.0000 mL | Freq: Once | INTRAVENOUS | Status: AC
  Administered 2016-11-08: 500 mL via INTRAVENOUS

## 2016-11-08 MED ORDER — LACTATED RINGERS IV SOLN: 500.0000 mL | INTRAVENOUS | Status: DC | PRN

## 2016-11-08 MED ORDER — SENNOSIDES-DOCUSATE SODIUM 8.6-50 MG PO TABS
2.0000 | ORAL_TABLET | ORAL | Status: DC
  Administered 2016-11-09: 2 via ORAL
  Filled 2016-11-08: qty 2

## 2016-11-08 MED ORDER — LIDOCAINE HCL (PF) 1 % IJ SOLN: 30.0000 mL | INTRAMUSCULAR | Status: DC | PRN

## 2016-11-08 MED ORDER — PHENYLEPHRINE 40 MCG/ML (10ML) SYRINGE FOR IV PUSH (FOR BLOOD PRESSURE SUPPORT)
80.0000 ug | PREFILLED_SYRINGE | INTRAVENOUS | Status: DC | PRN
  Filled 2016-11-08: qty 10
  Filled 2016-11-08: qty 5

## 2016-11-08 MED ORDER — TETANUS-DIPHTH-ACELL PERTUSSIS 5-2.5-18.5 LF-MCG/0.5 IM SUSP: 0.5000 mL | Freq: Once | INTRAMUSCULAR | Status: DC

## 2016-11-08 MED ORDER — WITCH HAZEL-GLYCERIN EX PADS: 1.0000 "application " | MEDICATED_PAD | CUTANEOUS | Status: DC | PRN

## 2016-11-08 MED ORDER — DIPHENHYDRAMINE HCL 25 MG PO CAPS: 25.0000 mg | ORAL_CAPSULE | Freq: Four times a day (QID) | ORAL | Status: DC | PRN

## 2016-11-08 MED ORDER — FLEET ENEMA 7-19 GM/118ML RE ENEM: 1.0000 | ENEMA | RECTAL | Status: DC | PRN

## 2016-11-08 MED ORDER — LIDOCAINE HCL (PF) 1 % IJ SOLN
30.0000 mL | INTRAMUSCULAR | Status: DC | PRN
  Administered 2016-11-08: 30 mL via SUBCUTANEOUS
  Filled 2016-11-08: qty 30

## 2016-11-08 MED ORDER — FENTANYL 2.5 MCG/ML BUPIVACAINE 1/10 % EPIDURAL INFUSION (WH - ANES)
14.0000 mL/h | INTRAMUSCULAR | Status: DC | PRN
  Administered 2016-11-08: 14 mL/h via EPIDURAL
  Filled 2016-11-08: qty 100

## 2016-11-08 MED ORDER — LACTATED RINGERS IV SOLN
INTRAVENOUS | Status: DC
  Administered 2016-11-08: 12:00:00 via INTRAVENOUS

## 2016-11-08 MED ORDER — ALBUTEROL SULFATE (2.5 MG/3ML) 0.083% IN NEBU: 3.0000 mL | INHALATION_SOLUTION | RESPIRATORY_TRACT | Status: DC | PRN

## 2016-11-08 MED ORDER — BUTORPHANOL TARTRATE 1 MG/ML IJ SOLN
1.0000 mg | Freq: Once | INTRAMUSCULAR | Status: AC
  Administered 2016-11-08: 1 mg via INTRAVENOUS
  Filled 2016-11-08: qty 1

## 2016-11-08 MED ORDER — PHENYLEPHRINE 40 MCG/ML (10ML) SYRINGE FOR IV PUSH (FOR BLOOD PRESSURE SUPPORT)
80.0000 ug | PREFILLED_SYRINGE | INTRAVENOUS | Status: DC | PRN
  Filled 2016-11-08: qty 5

## 2016-11-08 MED ORDER — COCONUT OIL OIL: 1.0000 "application " | TOPICAL_OIL | Status: DC | PRN

## 2016-11-08 MED ORDER — LIDOCAINE HCL (PF) 1 % IJ SOLN
INTRAMUSCULAR | Status: DC | PRN
  Administered 2016-11-08 (×2): 5 mL

## 2016-11-08 MED ORDER — OXYCODONE-ACETAMINOPHEN 5-325 MG PO TABS: 1.0000 | ORAL_TABLET | ORAL | Status: DC | PRN

## 2016-11-08 MED ORDER — OXYTOCIN BOLUS FROM INFUSION: 500.0000 mL | Freq: Once | INTRAVENOUS | Status: DC

## 2016-11-08 MED ORDER — PRENATAL MULTIVITAMIN CH
1.0000 | ORAL_TABLET | Freq: Every day | ORAL | Status: DC
  Administered 2016-11-09: 1 via ORAL
  Filled 2016-11-08: qty 1

## 2016-11-08 MED ORDER — TERBUTALINE SULFATE 1 MG/ML IJ SOLN
0.2500 mg | Freq: Once | INTRAMUSCULAR | Status: DC | PRN
  Filled 2016-11-08: qty 1

## 2016-11-08 MED ORDER — FENTANYL CITRATE (PF) 100 MCG/2ML IJ SOLN: 50.0000 ug | INTRAMUSCULAR | Status: DC | PRN

## 2016-11-08 MED ORDER — HYDROXYZINE HCL 50 MG PO TABS
50.0000 mg | ORAL_TABLET | Freq: Four times a day (QID) | ORAL | Status: DC | PRN
  Filled 2016-11-08: qty 1

## 2016-11-08 NOTE — MAU Note (Signed)
Ctx about 4-6 min apart. Denies any vaginal bleeding or leaking. Good fetal movement reported.

## 2016-11-08 NOTE — Anesthesia Pain Management Evaluation Note (Signed)
  CRNA Pain Management Visit Note  Patient: Bailey Sullivan, 36 y.o., female  "Hello I am a member of the anesthesia team at [facility]. We have an anesthesia team available at all times to provide care throughout the hospital, including epidural management and anesthesia for C-section. I don't know your plan for the delivery whether it a natural birth, water birth, IV sedation, nitrous supplementation, doula or epidural, but we want to meet your pain goals."   1.Was your pain managed to your expectations on prior hospitalizations?   No prior hospitalizations  2.What is your expectation for pain management during this hospitalization?     Epidural  3.How can we help you reach that goal? Epidural infusing patient comfortable  Record the patient's initial score and the patient's pain goal.   Pain: 2  Pain Goal: 5 [facility] wants you to be able to say your pain was always managed very well.  [facility] 11/08/2016

## 2016-11-08 NOTE — H&P (Signed)
OBSTETRIC ADMISSION HISTORY AND PHYSICAL  Bailey Sullivan is a 36 y.o. female [phone] with IUP at [redacted]w[redacted]d by LMP presenting for active labor. She reports +FMs, No LOF, no VB, no blurry vision, headaches or peripheral edema, and RUQ pain.  She plans on breast feeding. She request POPs/vasectomy for birth control. She received her prenatal care at [facility]   Dating: By LMP --->  Estimated Date of Delivery: 11/05/16  Sono:    @[redacted]w[redacted]d , CWD, normal anatomy, cephalic presentation, 081K,   Prenatal History/Complications: H/o gHTN in prior pregnancy - on ASA Low risk pregnancy H/o c-section but has VBAC'd x3 Fetal echogenic intracardiac focus on [place] - Isolated, Informaseq: neg AMA  Past Medical History: Past Medical History:  Diagnosis Date  . Allergy   . AMA (advanced maternal age) multigravida 35+ 10/15/2013  . Asthma   . Breast pain, left 10/28/2012   Still breast feeding ?cyst will re check in 1 month if still there will get [place]  . Contraceptive management 10/21/2015  . Fatigue 11/25/2012  . No pertinent past medical history   . Reactive airways dysfunction syndrome [facility])     Past Surgical History: Past Surgical History:  Procedure Laterality Date  . CESAREAN SECTION    . DILATION AND CURETTAGE OF UTERUS    . wisom teeth      Obstetrical History: OB History    Gravida Para Term Preterm AB Living   6 4 4  0 1 4   SAB TAB Ectopic Multiple Live Births   1 0 0 0 4      Social History: Social History   Social History  . Marital status: Married    Spouse name: N/A  . Number of children: N/A  . Years of education: N/A   Social History Main Topics  . Smoking status: Never Smoker  . Smokeless tobacco: Never Used  . Alcohol use No  . Drug use: No  . Sexual activity: Yes    Birth control/ protection: None   Other Topics Concern  . None   Social History Narrative  . None    Family History: Family History  Problem Relation Age of Onset  . Cancer Mother        breast   . Hypertension Mother   . Hyperlipidemia Mother   . Thyroid cancer Mother        had thyroid removed  . Hypertension Father   . Parkinson's disease Father   . Parkinsonism Father   . Heart attack Maternal Grandfather   . ALS Maternal Grandmother   . Cancer Other        leukemia; dad's paternal aunt  . Cancer Maternal Aunt        breast  . Dementia Maternal Aunt   . COPD Paternal Uncle   . Heart disease Maternal Aunt     Allergies: Allergies  Allergen Reactions  . Amoxicillin Hives    Has patient had a PCN reaction causing immediate rash, facial/tongue/throat swelling, SOB or lightheadedness with hypotension: Yes Has patient had a PCN reaction causing severe rash involving mucus membranes or skin necrosis: No Has patient had a PCN reaction that required hospitalization: No Has patient had a PCN reaction occurring within the last 10 years: No If all of the above answers are "NO", then may proceed with Cephalosporin use.  . Sulfa Antibiotics Hives    Prescriptions Prior to Admission  Medication Sig Dispense Refill Last Dose  . acetaminophen (TYLENOL) 325 MG tablet Take 650 mg  by mouth every 6 (six) hours as needed for mild pain or headache.    11/07/2016 at Unknown time  . albuterol (PROAIR HFA) 108 (90 Base) MCG/ACT inhaler Inhale 2 puffs into the lungs every 4 (four) hours as needed for wheezing or shortness of breath. 8.5 g 5 Past Week at Unknown time  . aspirin EC 81 MG tablet Take 81 mg by mouth daily.   11/08/2016 at Unknown time  . BREO ELLIPTA 100-25 MCG/INH AEPB INHALE ONE PUFF INTO THE LUNGS DAILY. 60 each 5 11/08/2016 at Unknown time  . calcium carbonate (TUMS - DOSED IN MG ELEMENTAL CALCIUM) 500 MG chewable tablet Chew 1 tablet by mouth 2 (two) times daily as needed for indigestion or heartburn.   11/07/2016 at Unknown time  . Cetirizine HCl (ZYRTEC ALLERGY PO) Take 1 tablet by mouth daily as needed (allergies).    11/08/2016 at Unknown time  . ferrous sulfate 325 (65 FE) MG  tablet Take 1 tablet (325 mg total) by mouth 2 (two) times daily with a meal. 60 tablet 3 11/08/2016 at Unknown time  . omeprazole (PRILOSEC) 40 MG capsule Take 40 mg by mouth daily.   11/08/2016 at Unknown time  . Prenat-Fe Carbonyl-FA-Omega 3 (ONE-A-DAY WOMENS PRENATAL 1 PO) Take 1 tablet by mouth daily.    11/08/2016 at Unknown time     Review of Systems   All systems reviewed and negative except as stated in HPI  Blood pressure 117/75, pulse 91, temperature 98.4 F (36.9 C), temperature source Oral, resp. rate 18, height 5\' 3"  (1.6 m), weight 86.2 kg (190 lb), last menstrual period 01/11/2016, SpO2 90 %. General appearance: alert, cooperative and no distress Lungs: clear to auscultation bilaterally Heart: regular rate and rhythm Abdomen: soft, non-tender; bowel sounds normal Pelvic: deferred Extremities: Homans sign is negative, no sign of DVT Presentation: cephalic Fetal monitoringBaseline: 135 bpm, Variability: Good {> 6 bpm), Accelerations: Reactive and Decelerations: Absent Uterine activityFrequency: Every 2-4 minutes and Duration: 70-120 seconds Dilation: 5 Effacement (%): 50 Station: -1 Exam by:: K.Wilson,Rn  Prenatal labs: ABO, Rh: --/--/A POS (08/02 1138) Antibody: NEG (08/02 1138) Rubella: 2.15 (12/28 1514) RPR: Non Reactive (05/04 0913)  HBsAg: Negative (12/28 1514)  HIV:   nonreactive GBS:   negative 1 hr Glucola 135 Genetic screening Informaseq: neg,  Anatomy US normal  Prenatal Transfer Tool  Maternal Diabetes: No Genetic Screening: Normal Informaseq: neg Maternal Ultrasounds/Referrals: Normal Fetal Ultrasounds or other Referrals:  Other:  Fetal echogenic intracardiac focus on [place] - Isolated, Informaseq: neg Maternal Substance Abuse:  No Significant Maternal Medications:  Meds include: Zantac Protonix Significant Maternal Lab Results: None  Results for orders placed or performed during the hospital encounter of 11/08/16 (from the past 24 hour(s))  CBC    Collection Time: 11/08/16 11:33 AM  Result Value Ref Range   WBC 16.5 (H) 4.0 - 10.5 K/uL   RBC 4.72 3.87 - 5.11 MIL/uL   Hemoglobin 13.5 12.0 - 15.0 g/dL   HCT 41.0 36.0 - 46.0 %   MCV 86.9 78.0 - 100.0 fL   MCH 28.6 26.0 - 34.0 pg   MCHC 32.9 30.0 - 36.0 g/dL   RDW 17.1 (H) 11.5 - 15.5 %   Platelets 484 (H) 150 - 400 K/uL  Type and screen [place]   Collection Time: 11/08/16 11:38 AM  Result Value Ref Range   ABO/RH(D) A POS    Antibody Screen NEG    Sample Expiration 11/11/2016  Patient Active Problem List   Diagnosis Date Noted  . Labor and delivery, indication for care 11/08/2016  . Post-dates pregnancy 11/08/2016  . Family history of breast cancer 10/16/2016  . Fetal echogenic intracardiac focus on prenatal ultrasound 06/21/2016  . Previous cesarean section complicating pregnancy [id number]  . Supervision of normal pregnancy 03/12/2016  . Asthma with acute exacerbation 08/11/2015  . History of gestational hypertension 07/13/2014  . AMA (advanced maternal age) multigravida 35+ 10/15/2013  . Fatigue 11/25/2012    Assessment/Plan:  Bailey Sullivan is a 36 y.o. [id number] at [redacted]w[redacted]d here for SOL. H/o prior c-section and VBAC x3.   #Labor: Expectant management. Plan for VBAC. AROM and augment as needed.  #Pain: Epidural to be placed #FWB: Cat 1  #ID: GBS negative #MOF: breast #MOC: vasectomy, POP   Sela Hua, MD  11/08/2016, 1:50 PM  OB FELLOW HISTORY AND PHYSICAL ATTESTATION  I confirm that I have verified the information documented in the resident's note and that I have also personally reperformed the physical exam and all medical decision making activities. I agree with above documentation and have made edits as needed.   Luiz Blare OB Fellow 11/08/2016, 4:58 PM

## 2016-11-08 NOTE — Anesthesia Procedure Notes (Signed)
Epidural Patient location during procedure: OB  Staffing Anesthesiologist: Montez Hageman Performed: anesthesiologist   Preanesthetic Checklist Completed: patient identified, site marked, surgical consent, pre-op evaluation, timeout performed, IV checked, risks and benefits discussed and monitors and equipment checked  Epidural Patient position: sitting Prep: DuraPrep Patient monitoring: heart rate, continuous pulse ox and blood pressure Approach: right paramedian Location: L3-L4 Injection technique: LOR saline  Needle:  Needle type: Tuohy  Needle gauge: 17 G Needle length: 9 cm and 9 Needle insertion depth: 5 cm Catheter type: closed end flexible Catheter size: 20 Guage Catheter at skin depth: 9 cm Test dose: negative  Assessment Events: blood not aspirated, injection not painful, no injection resistance, negative IV test and no paresthesia  Additional Notes Patient identified. Risks/Benefits/Options discussed with patient including but not limited to bleeding, infection, nerve damage, paralysis, failed block, incomplete pain control, headache, blood pressure changes, nausea, vomiting, reactions to medication both or allergic, itching and postpartum back pain. Confirmed with bedside nurse the patient's most recent platelet count. Confirmed with patient that they are not currently taking any anticoagulation, have any bleeding history or any family history of bleeding disorders. Patient expressed understanding and wished to proceed. All questions were answered. Sterile technique was used throughout the entire procedure. Please see nursing notes for vital signs. Test dose was given through epidural needle and negative prior to continuing to dose epidural or start infusion. Warning signs of high block given to the patient including shortness of breath, tingling/numbness in hands, complete motor block, or any concerning symptoms with instructions to call for help. Patient was given  instructions on fall risk and not to get out of bed. All questions and concerns addressed with instructions to call with any issues.

## 2016-11-08 NOTE — Anesthesia Preprocedure Evaluation (Signed)
Anesthesia Evaluation  Patient identified by MRN, date of birth, ID band Patient awake    Reviewed: Allergy & Precautions, H&P , NPO status , Patient's Chart, lab work & pertinent test results  History of Anesthesia Complications Negative for: history of anesthetic complications  Airway Mallampati: II  TM Distance: >3 FB Neck ROM: full    Dental no notable dental hx. (+) Teeth Intact   Pulmonary asthma ,    Pulmonary exam normal breath sounds clear to auscultation       Cardiovascular negative cardio ROS Normal cardiovascular exam Rhythm:regular Rate:Normal     Neuro/Psych negative neurological ROS  negative psych ROS   GI/Hepatic negative GI ROS, Neg liver ROS,   Endo/Other  negative endocrine ROS  Renal/GU negative Renal ROS  negative genitourinary   Musculoskeletal   Abdominal   Peds  Hematology negative hematology ROS (+)   Anesthesia Other Findings   Reproductive/Obstetrics (+) Pregnancy                             Anesthesia Physical Anesthesia Plan  ASA: II  Anesthesia Plan: Epidural   Post-op Pain Management:    Induction:   PONV Risk Score and Plan:   Airway Management Planned:   Additional Equipment:   Intra-op Plan:   Post-operative Plan:   Informed Consent: I have reviewed the patients History and Physical, chart, labs and discussed the procedure including the risks, benefits and alternatives for the proposed anesthesia with the patient or authorized representative who has indicated his/her understanding and acceptance.     Plan Discussed with:   Anesthesia Plan Comments:         Anesthesia Quick Evaluation

## 2016-11-08 NOTE — Plan of Care (Signed)
Problem: Role Relationship: Goal: Ability to demonstrate positive interaction with newborn will improve Outcome: 64 Mom is bonding well with baby.

## 2016-11-09 ENCOUNTER — Other Ambulatory Visit: Payer: BLUE CROSS/BLUE SHIELD | Admitting: Obstetrics & Gynecology

## 2016-11-09 LAB — RPR: RPR Ser Ql: NONREACTIVE

## 2016-11-09 MED ORDER — IBUPROFEN 600 MG PO TABS: 600.0000 mg | ORAL_TABLET | Freq: Four times a day (QID) | ORAL | 0 refills | Status: DC

## 2016-11-09 NOTE — Discharge Instructions (Signed)
Postpartum Care After Vaginal Delivery °The period of time right after you deliver your newborn is called the postpartum period. °What kind of medical care will I receive? °· You may continue to receive fluids and medicines through an IV tube inserted into one of your veins. °· If an incision was made near your vagina (episiotomy) or if you had some vaginal tearing during delivery, cold compresses may be placed on your episiotomy or your tear. This helps to reduce pain and swelling. °· You may be given a squirt bottle to use when you go to the bathroom. You may use this until you are comfortable wiping as usual. To use the squirt bottle, follow these steps: °? Before you urinate, fill the squirt bottle with warm water. Do not use hot water. °? After you urinate, while you are sitting on the toilet, use the squirt bottle to rinse the area around your urethra and vaginal opening. This rinses away any urine and blood. °? You may do this instead of wiping. As you start healing, you may use the squirt bottle before wiping yourself. Make sure to wipe gently. °? Fill the squirt bottle with clean water every time you use the bathroom. °· You will be given sanitary pads to wear. °How can I expect to feel? °· You may not feel the need to urinate for several hours after delivery. °· You will have some soreness and pain in your abdomen and vagina. °· If you are breastfeeding, you may have uterine contractions every time you breastfeed for up to several weeks postpartum. Uterine contractions help your uterus return to its normal size. °· It is normal to have vaginal bleeding (lochia) after delivery. The amount and appearance of lochia is often similar to a menstrual period in the first week after delivery. It will gradually decrease over the next few weeks to a dry, yellow-brown discharge. For most women, lochia stops completely by 6-8 weeks after delivery. Vaginal bleeding can vary from woman to woman. °· Within the first few  days after delivery, you may have breast engorgement. This is when your breasts feel heavy, full, and uncomfortable. Your breasts may also throb and feel hard, tightly stretched, warm, and tender. After this occurs, you may have milk leaking from your breasts. Your health care provider can help you relieve discomfort due to breast engorgement. Breast engorgement should go away within a few days. °· You may feel more sad or worried than normal due to hormonal changes after delivery. These feelings should not last more than a few days. If these feelings do not go away after several days, speak with your health care provider. °How should I care for myself? °· Tell your health care provider if you have pain or discomfort. °· Drink enough water to keep your urine clear or pale yellow. °· Wash your hands thoroughly with soap and water for at least 20 seconds after changing your sanitary pads, after using the toilet, and before holding or feeding your baby. °· If you are not breastfeeding, avoid touching your breasts a lot. Doing this can make your breasts produce more milk. °· If you become weak or lightheaded, or you feel like you might faint, ask for help before: °? Getting out of bed. °? Showering. °· Change your sanitary pads frequently. Watch for any changes in your flow, such as a sudden increase in volume, a change in color, the passing of large blood clots. If you pass a blood clot from your vagina, save it   to show to your health care provider. Do not flush blood clots down the toilet without having your health care provider look at them. °· Make sure that all your vaccinations are up to date. This can help protect you and your baby from getting certain diseases. You may need to have immunizations done before you leave the hospital. °· If desired, talk with your health care provider about methods of family planning or birth control (contraception). °How can I start bonding with my baby? °Spending as much time as  possible with your baby is very important. During this time, you and your baby can get to know each other and develop a bond. Having your baby stay with you in your room (rooming in) can give you time to get to know your baby. Rooming in can also help you become comfortable caring for your baby. Breastfeeding can also help you bond with your baby. °How can I plan for returning home with my baby? °· Make sure that you have a car seat installed in your vehicle. °? Your car seat should be checked by a certified car seat installer to make sure that it is installed safely. °? Make sure that your baby fits into the car seat safely. °· Ask your health care provider any questions you have about caring for yourself or your baby. Make sure that you are able to contact your health care provider with any questions after leaving the hospital. °This information is not intended to replace advice given to you by your health care provider. Make sure you discuss any questions you have with your health care provider. °Document Released: 01/21/2007 Document Revised: 08/29/2015 Document Reviewed: 02/28/2015 °Elsevier Interactive Patient Education © 2018 [employer] ° °

## 2016-11-09 NOTE — Anesthesia Postprocedure Evaluation (Signed)
Anesthesia Post Note  Patient: Bailey Sullivan  Procedure(s) Performed: * No procedures listed *     Patient location during evaluation: Mother Baby Anesthesia Type: Epidural Level of consciousness: awake and alert and oriented Pain management: satisfactory to patient Vital Signs Assessment: post-procedure vital signs reviewed and stable Respiratory status: spontaneous breathing and nonlabored ventilation Cardiovascular status: stable Postop Assessment: no headache, no backache, no signs of nausea or vomiting, adequate PO intake and patient able to bend at knees (patient up walking) Anesthetic complications: no    Last Vitals:  Vitals:   11/09/16 0544 11/09/16 0830  BP: 126/82 129/73  Pulse: 86 96  Resp: 17 18  Temp: 36.8 C 36.5 C    Last Pain:  Vitals:   11/09/16 0830  TempSrc: Oral  PainSc: 0-No pain   Pain Goal:                 Morganne Haile

## 2016-11-09 NOTE — Progress Notes (Signed)
POSTPARTUM PROGRESS NOTE  Post Partum Day 1 Subjective:  Bailey Sullivan is a 36 y.o. [id number] [redacted]w[redacted]d s/p svd.  No acute events overnight.  Pt denies problems with ambulating, voiding or po intake.  She denies nausea or vomiting.  Pain is well controlled.  She has had flatus. She has had bowel movement.  Lochia Small.   Objective: Blood pressure 126/82, pulse 86, temperature 98.3 F (36.8 C), temperature source Oral, resp. rate 17, height 5\' 3"  (1.6 m), weight 86.2 kg (190 lb), last menstrual period 01/11/2016, SpO2 97 %, unknown if currently breastfeeding.  Physical Exam:  General: alert, cooperative and no distress Lochia:normal flow Chest: CTAB Heart: RRR no m/r/g Abdomen: +BS, soft, nontender,  Uterine Fundus: firm, DVT Evaluation: No calf swelling or tenderness Extremities: no edema   Recent Labs  11/08/16 1133  HGB 13.5  HCT 41.0    Assessment/Plan:  ASSESSMENT: Bailey Sullivan is a 36 y.o. [id number] [redacted]w[redacted]d s/p svd  Discharge home at @1700  per pt request   LOS: 1 day   Rodney Booze, Medical Student   OB [place] NOTE ATTESTATION  I confirm that I have verified the information documented in the resident's note and that I have also personally performed the physical exam and all medical decision making activities.  D/c home today  Gailen Shelter, MD OB Fellow 11/09/2016, 10:41 AM

## 2016-11-09 NOTE — Lactation Note (Signed)
This note was copied from a baby's chart. Lactation Consultation Note Experienced BF mom BF her other 4 children 13-18 months w/o difficulty. Youngest child 38 yrs old.  Mom BF baby when [place] entered rm in cradle position. Mom has pendulum breast w/everted nipples. Discussed position options.  Reviewed cluster feeding, STS, I&O.  Encouraged to call for questions or concerns. Patient Name: Bailey Sullivan [id number] Date: 11/09/2016 Reason for consult: Initial assessment   Maternal Data Has patient been taught Hand Expression?: Yes Does the patient have breastfeeding experience prior to this delivery?: Yes  Feeding Feeding Type: Breast Fed Length of feed: 27 min  LATCH Score Latch: Grasps breast easily, tongue down, lips flanged, rhythmical sucking.  Audible Swallowing: Spontaneous and intermittent  Type of Nipple: Everted at rest and after stimulation  Comfort (Breast/Nipple): Soft / non-tender  Hold (Positioning): No assistance needed to correctly position infant at breast.  LATCH Score: 10  Interventions Interventions: Position options;Support pillows  Lactation Tools Discussed/Used     Consult Status Consult Status: PRN Date: 11/09/16 Follow-up type: In-patient    Bailey Sullivan, [place]ta [place] 11/09/2016, 1:31 AM

## 2016-11-09 NOTE — Discharge Summary (Signed)
OB Discharge Summary     Patient Name: Bailey Sullivan DOB: [birth year] MRN: [id number]  Date of admission: 11/08/2016 Delivering MD: Katheren Shams   Date of discharge: 11/09/2016  Admitting diagnosis: LABOR Intrauterine pregnancy: [redacted]w[redacted]d     Secondary diagnosis:  Principal Problem:   VBAC, delivered, current hospitalization Active Problems:   AMA (advanced maternal age) multigravida 35+   Post-dates pregnancy  Additional problems: hx of cesarean section     Discharge diagnosis: Term Pregnancy Delivered and VBAC                                                                                                Post partum procedures:none  Augmentation: AROM  Complications: None  Hospital course:  Onset of Labor With Vaginal Delivery     36 y.o. yo [id number] at [redacted]w[redacted]d was admitted in Active Labor on 11/08/2016. Patient had an uncomplicated labor course as follows:  Membrane Rupture Time/Date: 5:07 PM ,11/08/2016   Intrapartum Procedures: Episiotomy: None [1]                                         Lacerations:  1st degree [2]  Patient had a delivery of a Viable infant. 11/08/2016  Information for the patient's newborn:  Aariona, Momon [[id number]]  Delivery Method: VBAC, Spontaneous (Filed from Delivery Summary)    Pateint had an uncomplicated postpartum course.  She is ambulating, tolerating a regular diet, passing flatus, and urinating well. Patient is discharged home in stable condition on 11/09/16.   Physical exam  Vitals:   11/08/16 1936 11/08/16 2036 11/09/16 0544 11/09/16 0830  BP: 125/77 127/74 126/82 129/73  Pulse: 95 99 86 96  Resp: 18 18 17 18   Temp: 99.3 F (37.4 C) 98.2 F (36.8 C) 98.3 F (36.8 C) 97.7 F (36.5 C)  TempSrc: Oral Oral Oral Oral  SpO2:      Weight:      Height:       General: alert, cooperative and no distress Lochia: appropriate Uterine Fundus: firm Incision: N/A DVT Evaluation: No evidence of DVT seen on physical exam. Labs: Lab Results   Component Value Date   WBC 16.5 (H) 11/08/2016   HGB 13.5 11/08/2016   HCT 41.0 11/08/2016   MCV 86.9 11/08/2016   PLT 484 (H) 11/08/2016   CMP Latest Ref Rng & Units 11/04/2016  Glucose 65 - 99 mg/dL 83  BUN 6 - 20 mg/dL 7  Creatinine 0.44 - 1.00 mg/dL 0.42(L)  Sodium 135 - 145 mmol/L 135  Potassium 3.5 - 5.1 mmol/L 3.9  Chloride 101 - 111 mmol/L 107  CO2 22 - 32 mmol/L 18(L)  Calcium 8.9 - 10.3 mg/dL 9.4  Total Protein 6.5 - 8.1 g/dL 6.7  Total Bilirubin 0.3 - 1.2 mg/dL 0.4  Alkaline Phos 38 - 126 U/L 91  AST 15 - 41 U/L 18  ALT 14 - 54 U/L 9(L)    Discharge instruction: per After Visit Summary and "Baby and Me Booklet".  After visit meds:  Allergies as of 11/09/2016      Reactions   Amoxicillin Hives   Has patient had a PCN reaction causing immediate rash, facial/tongue/throat swelling, SOB or lightheadedness with hypotension: Yes Has patient had a PCN reaction causing severe rash involving mucus membranes or skin necrosis: No Has patient had a PCN reaction that required hospitalization: No Has patient had a PCN reaction occurring within the last 10 years: No If all of the above answers are "NO", then may proceed with Cephalosporin use.   Sulfa Antibiotics Hives      Medication List    TAKE these medications   acetaminophen 325 MG tablet Commonly known as:  TYLENOL Take 650 mg by mouth every 6 (six) hours as needed for mild pain or headache.   albuterol 108 (90 Base) MCG/ACT inhaler Commonly known as:  PROAIR HFA Inhale 2 puffs into the lungs every 4 (four) hours as needed for wheezing or shortness of breath.   aspirin EC 81 MG tablet Take 81 mg by mouth daily.   BREO ELLIPTA 100-25 MCG/INH Aepb Generic drug:  fluticasone furoate-vilanterol INHALE ONE PUFF INTO THE LUNGS DAILY.   calcium carbonate 500 MG chewable tablet Commonly known as:  TUMS - dosed in mg elemental calcium Chew 1 tablet by mouth 2 (two) times daily as needed for indigestion or  heartburn.   ferrous sulfate 325 (65 FE) MG tablet Take 1 tablet (325 mg total) by mouth 2 (two) times daily with a meal.   ibuprofen 600 MG tablet Commonly known as:  ADVIL,MOTRIN Take 1 tablet (600 mg total) by mouth every 6 (six) hours.   omeprazole 40 MG capsule Commonly known as:  PRILOSEC Take 40 mg by mouth daily.   ONE-A-DAY WOMENS PRENATAL 1 PO Take 1 tablet by mouth daily.   ZYRTEC ALLERGY PO Take 1 tablet by mouth daily as needed (allergies).       Diet: routine diet  Activity: Advance as tolerated. Pelvic rest for 6 weeks.   Outpatient follow up:6 weeks Follow up Appt:Future Appointments Date Time Provider [place]  12/21/2016 10:30 AM Roma Schanz, CNM [facility]   Follow up Visit:No Follow-up on file.  Postpartum contraception: vasectomy/POPs  Newborn Data: Live born female  Birth Weight: 7 lb 14.8 oz (3595 g) APGAR: 9, 9  Baby Feeding: Breast Disposition:home with mother   11/09/2016 Gailen Shelter, MD

## 2016-11-12 ENCOUNTER — Inpatient Hospital Stay (HOSPITAL_COMMUNITY): Admission: RE | Admit: 2016-11-12 | Payer: BLUE CROSS/BLUE SHIELD | Source: Ambulatory Visit

## 2016-12-21 ENCOUNTER — Ambulatory Visit (INDEPENDENT_AMBULATORY_CARE_PROVIDER_SITE_OTHER): Payer: BLUE CROSS/BLUE SHIELD | Admitting: Women's Health

## 2016-12-21 ENCOUNTER — Encounter: Payer: Self-pay | Admitting: Women's Health

## 2016-12-21 DIAGNOSIS — O165 Unspecified maternal hypertension, complicating the puerperium: Secondary | ICD-10-CM | POA: Insufficient documentation

## 2016-12-21 DIAGNOSIS — Z3202 Encounter for pregnancy test, result negative: Secondary | ICD-10-CM | POA: Diagnosis not present

## 2016-12-21 LAB — POCT URINE PREGNANCY: PREG TEST UR: NEGATIVE

## 2016-12-21 MED ORDER — AMLODIPINE BESYLATE 5 MG PO TABS: 5.0000 mg | ORAL_TABLET | Freq: Every day | ORAL | 0 refills | Status: DC

## 2016-12-21 MED ORDER — NORETHINDRONE 0.35 MG PO TABS: ORAL_TABLET | ORAL | 11 refills | Status: DC

## 2016-12-21 NOTE — Addendum Note (Signed)
Addended by: Linton Rump on: 12/21/2016 11:52 AM   Modules accepted: Orders

## 2016-12-21 NOTE — Patient Instructions (Signed)
Stop taking blood pressure medicine 2 days before your next visit

## 2016-12-21 NOTE — Progress Notes (Signed)
Subjective:    Bailey Sullivan is a 36 y.o. [phone] Caucasian female who presents for a postpartum visit. She is 6 weeks postpartum following a vaginal birth after cesarean (VBAC) at 40.3 gestational weeks. Anesthesia: epidural. I have fully reviewed the prenatal and intrapartum course. Postpartum course has been uncomplicated. Baby's course has been uncomplicated. Baby is feeding by breast. Bleeding no bleeding. Bowel function is normal. Bladder function is normal. Patient is not sexually active. Last sexual activity: prior to birth of baby. Contraception method is husband had vasectomy- goes back in Nov for confirmation of being sterile, pt wants POPs until then. Postpartum depression screening: negative. Score 1.  Last pap 10/15/13 and was neg.  The following portions of the patient's history were reviewed and updated as appropriate: allergies, current medications, past medical history, past surgical history and problem list.  Review of Systems Pertinent items are noted in HPI.   Vitals:   12/21/16 1104  BP: (!) 140/98  Pulse: 75  Weight: 174 lb 8 oz (79.2 kg)  Height: 5\' 3"  (1.6 m)   BP recheck 150/100  Patient's last menstrual period was 01/11/2016 (exact date).  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

## 2017-01-04 ENCOUNTER — Other Ambulatory Visit: Payer: BLUE CROSS/BLUE SHIELD | Admitting: Women's Health

## 2017-01-11 ENCOUNTER — Other Ambulatory Visit (HOSPITAL_COMMUNITY)
Admission: RE | Admit: 2017-01-11 | Discharge: 2017-01-11 | Disposition: A | Discharge status: Home / Self Care | Payer: BLUE CROSS/BLUE SHIELD | Source: Ambulatory Visit | Attending: Obstetrics & Gynecology | Admitting: Obstetrics & Gynecology

## 2017-01-11 ENCOUNTER — Ambulatory Visit (INDEPENDENT_AMBULATORY_CARE_PROVIDER_SITE_OTHER): Payer: BLUE CROSS/BLUE SHIELD | Admitting: Women's Health

## 2017-01-11 ENCOUNTER — Encounter: Payer: Self-pay | Admitting: Women's Health

## 2017-01-11 VITALS — BP 120/80 | HR 80 | Ht 61.4 in | Wt 170.0 lb

## 2017-01-11 DIAGNOSIS — Z1151 Encounter for screening for human papillomavirus (HPV): Secondary | ICD-10-CM | POA: Insufficient documentation

## 2017-01-11 DIAGNOSIS — Z01419 Encounter for gynecological examination (general) (routine) without abnormal findings: Secondary | ICD-10-CM | POA: Diagnosis not present

## 2017-01-11 NOTE — Addendum Note (Signed)
Addended by: Diona Fanti A on: 01/11/2017 12:49 PM   Modules accepted: Orders

## 2017-01-11 NOTE — Patient Instructions (Signed)
You do not have to restart the blood pressure medicine, your blood pressure is good! :)  You can stop the birth control pills if you want to once Bailey Sullivan gets the all clear from the vasectomy

## 2017-01-11 NOTE — Progress Notes (Signed)
   [facility]  Patient name: Bailey Sullivan MRN [id number]  Date of birth: July 09, 1980 CC & HPI:  Bailey Sullivan is a 36 y.o. [phone] Caucasian female being seen today for a routine well-woman exam. 2 months s/p VBAC. Breastfeeding, good supply. Dx w/ postpartum HTN at pp visit on 9/14, rx'd norvasc 5mg , which she stopped 2d ago as directed.  Current complaints: none  PCP: Dr. Wolfgang Phoenix      does not desire labs Patient's last menstrual period was 12/27/2016. The current method of family planning is oral progesterone-only contraceptive, husband has had vasectomy, goes back in Nov to confirm sterility  Last pap 10/15/13. Results were: normal Last mammogram: 2010 d/t strong family hx. Results were: normal. Mom dx w/ breast cancer @ 84yo, maternal aunt in 31s, recommended for pt to have yearly mammo/MRI, however she is still breastfeeding Last colonoscopy: never. Results were: n/a  Review of Systems:   Denies any headaches, blurred vision, fatigue, shortness of breath, chest pain, abdominal pain, abnormal vaginal discharge/itching/odor/irritation, problems with periods, bowel movements, urination, or intercourse unless otherwise stated above.  Pertinent History Reviewed:  Reviewed past medical,surgical, social and family history.  Reviewed problem list, medications and allergies. Objective Findings:   Vitals:   01/11/17 1202  BP: 120/80  Pulse: 80  Weight: 170 lb (77.1 kg)  Height: 5' 1.4" (1.56 m)    Body mass index is 31.7 kg/m.  Physical Examination: General appearance - well appearing, and in no distress Mental status - alert, oriented to person, place, and time Psych:  She has a normal mood and affect Skin - warm and dry, normal color, no suspicious lesions noted Chest - effort normal, all lung fields clear to auscultation bilaterally Heart - normal rate and regular rhythm Neck:  midline trachea, no thyromegaly or nodules Breasts - breasts appear normal, no  suspicious masses, no skin or nipple changes or  axillary nodes Abdomen - soft, nontender, nondistended, no masses or organomegaly Pelvic - VULVA: normal appearing vulva with no masses, tenderness or lesions  VAGINA: normal appearing vagina with normal color and discharge, no lesions  CERVIX: normal appearing cervix without discharge or lesions, no CMT  Thin prep pap is done w/ HR HPV cotesting  UTERUS: uterus is felt to be normal size, shape, consistency and nontender   ADNEXA: No adnexal masses or tenderness noted. Extremities:  No swelling or varicosities noted  No results found for this or any previous visit (from the past 24 hour(s)).  Assessment & Plan:  1) Healthy Well-Woman Exam 2) Resolved pp HTN> can stay off of norvasc 3) Strong family h/o breast cancer> recommend yearly mammograms/MRI, call us once finished breastfeeding to schedule 4) Breastfeeding  Continue micronor until husband gets 'all clear' in November from vasectomy Mammogram> as stated above Colonoscopy @36yo  or sooner if problems  Return in about 1 year (around 01/11/2018) for Physical.  Tawnya Crook CNM, [facility] 01/11/2017 12:37 PM

## 2017-01-15 LAB — CYTOLOGY - PAP
Diagnosis: NEGATIVE
HPV: NOT DETECTED

## 2017-02-08 ENCOUNTER — Ambulatory Visit: Payer: BLUE CROSS/BLUE SHIELD

## 2017-03-05 ENCOUNTER — Telehealth: Payer: Self-pay | Admitting: Family Medicine

## 2017-03-05 DIAGNOSIS — D229 Melanocytic nevi, unspecified: Secondary | ICD-10-CM

## 2017-03-05 NOTE — Telephone Encounter (Signed)
lets

## 2017-03-05 NOTE — Telephone Encounter (Signed)
Pt has a mole on her lower abdomen she would like removed  Please initiate referral to dermatology - pt prefers Dr. Denna Haggard due to he is within the [facility] system (he requires a referral)  Please call when done

## 2017-03-05 NOTE — Telephone Encounter (Signed)
Referral put in. Pt notified.  

## 2017-03-11 ENCOUNTER — Encounter: Payer: Self-pay | Admitting: Family Medicine

## 2017-04-22 DIAGNOSIS — D225 Melanocytic nevi of trunk: Secondary | ICD-10-CM | POA: Diagnosis not present

## 2017-04-23 ENCOUNTER — Other Ambulatory Visit: Payer: Self-pay | Admitting: Nurse Practitioner

## 2017-04-24 NOTE — Telephone Encounter (Signed)
One mo worth,  Needs o v

## 2017-06-19 ENCOUNTER — Encounter: Payer: Self-pay | Admitting: Family Medicine

## 2017-06-19 ENCOUNTER — Ambulatory Visit (INDEPENDENT_AMBULATORY_CARE_PROVIDER_SITE_OTHER): Payer: BLUE CROSS/BLUE SHIELD | Admitting: Family Medicine

## 2017-06-19 VITALS — BP 130/84 | Ht 61.0 in | Wt 160.0 lb

## 2017-06-19 DIAGNOSIS — M25562 Pain in left knee: Secondary | ICD-10-CM

## 2017-06-19 NOTE — Progress Notes (Signed)
   Subjective:    Patient ID: Bailey Sullivan, female    DOB: Jun 06, 1980, 37 y.o.   MRN: [id number]  Knee Pain   Incident onset: 2 weeks. There was no injury mechanism. The pain is present in the left knee. Treatments tried: motrin    Started hurting , noted with internal rotation   Not recently exercising , lst night  No major pain with walking  Certain movements     no hx of knee injuries    No known inry   no remote injuries   Took motrin one night , bothered some, has applied ice  And helped,   No swelling npted      Review of Systems No headache, no major weight loss or weight gain, no chest pain no back pain abdominal pain no change in bowel habits complete ROS otherwise negative     Objective:   Physical Exam Alert vitals stable, NAD. Blood pressure good on repeat. HEENT normal. Lungs clear. Heart regular rate and rhythm. Knee good range of motion no effusion.  No joint line tenderness no joint laxity.  Patient points towards region of medial meniscus for area of pain impression medial meniscal strain versus ligament strain.       Assessment & Plan:  No evidence of major challenge with the D.  Hold off on MRIs x-rays etc. rationale discussed.  2 Aleve twice per day.  Maintain exercise

## 2018-02-03 ENCOUNTER — Other Ambulatory Visit: Payer: Self-pay | Admitting: *Deleted

## 2018-02-03 MED ORDER — FLUTICASONE FUROATE-VILANTEROL 100-25 MCG/INH IN AEPB: INHALATION_SPRAY | RESPIRATORY_TRACT | 0 refills | Status: DC

## 2018-02-04 ENCOUNTER — Other Ambulatory Visit: Payer: Self-pay | Admitting: *Deleted

## 2018-02-04 MED ORDER — ALBUTEROL SULFATE HFA 108 (90 BASE) MCG/ACT IN AERS: 2.0000 | INHALATION_SPRAY | RESPIRATORY_TRACT | 5 refills | Status: DC | PRN

## 2018-05-16 ENCOUNTER — Other Ambulatory Visit: Payer: Self-pay | Admitting: Family Medicine

## 2018-06-24 ENCOUNTER — Other Ambulatory Visit: Payer: Self-pay | Admitting: Family Medicine

## 2018-09-30 DIAGNOSIS — L709 Acne, unspecified: Secondary | ICD-10-CM | POA: Diagnosis not present

## 2018-09-30 DIAGNOSIS — Z79899 Other long term (current) drug therapy: Secondary | ICD-10-CM | POA: Diagnosis not present

## 2018-10-16 ENCOUNTER — Other Ambulatory Visit: Payer: Self-pay | Admitting: Family Medicine

## 2018-10-16 NOTE — Telephone Encounter (Signed)
Sorry [place] one yr on a previntive, may ref times one but also needs o v next mo

## 2018-10-16 NOTE — Telephone Encounter (Signed)
Left message to return call 

## 2018-10-17 NOTE — Telephone Encounter (Signed)
Pt is scheduled for a virtual visit 7/16 with Dr. Richardson Landry

## 2018-10-23 ENCOUNTER — Other Ambulatory Visit: Payer: Self-pay

## 2018-10-23 ENCOUNTER — Ambulatory Visit (INDEPENDENT_AMBULATORY_CARE_PROVIDER_SITE_OTHER): Payer: BC Managed Care – PPO | Admitting: Family Medicine

## 2018-10-23 DIAGNOSIS — J4530 Mild persistent asthma, uncomplicated: Secondary | ICD-10-CM | POA: Diagnosis not present

## 2018-10-23 MED ORDER — BREO ELLIPTA 100-25 MCG/INH IN AEPB: INHALATION_SPRAY | RESPIRATORY_TRACT | 11 refills | Status: DC

## 2018-10-23 NOTE — Progress Notes (Signed)
   Subjective:    Patient ID: Bailey Sullivan, female    DOB: 12-23-1980, 38 y.o.   MRN: [id number]  HPI Pt has not been seen in over a year and is needing refills on Breo inhaler. Pt was approved for one refill but was advised to schedule a follow up visit. Pt is wanting to know if she is needing any lab work done.   Virtual Visit via Video Note  I connected with Bailey Sullivan on 10/23/18 at  8:50 AM EDT by a video enabled telemedicine application and verified that I am speaking with the correct person using two identifiers.  Location: Patient: home Provider: office   I discussed the limitations of evaluation and management by telemedicine and the availability of in person appointments. The patient expressed understanding and agreed to proceed.  History of Present Illness:    Observations/Objective:   Assessment and Plan:   Follow Up Instructions:    I discussed the assessment and treatment plan with the patient. The patient was provided an opportunity to ask questions and all were answered. The patient agreed with the plan and demonstrated an understanding of the instructions.   The patient was advised to call back or seek an in-person evaluation if the symptoms worsen or if the condition fails to improve as anticipated.  I provided 18 minutes of non-face-to-face time during this encounter.   Vicente Males, LPN  History of chronic asthma.  Uses preventive inhaler.  Breo.  Definitely helps.  Uses it seasonally and during periods of high irritant and/or respiratory infection.  No noticeable side effects.  Tolerates well helps immensely.  Also keeps rescue inhaler around 4 rare use  Review of Systems No headache, no major weight loss or weight gain, no chest pain no back pain abdominal pain no change in bowel habits complete ROS otherwise negative     Objective:   Physical Exam  Virtual      Assessment & Plan:  Impression chronic persistent asthma.  Brio refilled  rationale discussed years worth written

## 2019-01-02 ENCOUNTER — Other Ambulatory Visit: Payer: Self-pay

## 2019-01-03 ENCOUNTER — Other Ambulatory Visit (INDEPENDENT_AMBULATORY_CARE_PROVIDER_SITE_OTHER): Payer: BC Managed Care – PPO | Admitting: *Deleted

## 2019-01-03 DIAGNOSIS — Z23 Encounter for immunization: Secondary | ICD-10-CM

## 2019-01-27 ENCOUNTER — Other Ambulatory Visit: Payer: Self-pay

## 2019-01-27 DIAGNOSIS — Z20822 Contact with and (suspected) exposure to covid-19: Secondary | ICD-10-CM

## 2019-01-27 DIAGNOSIS — Z20828 Contact with and (suspected) exposure to other viral communicable diseases: Secondary | ICD-10-CM | POA: Diagnosis not present

## 2019-01-28 LAB — NOVEL CORONAVIRUS, NAA: SARS-CoV-2, NAA: NOT DETECTED

## 2019-02-03 ENCOUNTER — Other Ambulatory Visit: Payer: Self-pay

## 2019-02-03 DIAGNOSIS — Z20822 Contact with and (suspected) exposure to covid-19: Secondary | ICD-10-CM

## 2019-02-03 DIAGNOSIS — Z20828 Contact with and (suspected) exposure to other viral communicable diseases: Secondary | ICD-10-CM | POA: Diagnosis not present

## 2019-02-04 ENCOUNTER — Ambulatory Visit (INDEPENDENT_AMBULATORY_CARE_PROVIDER_SITE_OTHER): Payer: BC Managed Care – PPO | Admitting: Family Medicine

## 2019-02-04 ENCOUNTER — Encounter: Payer: Self-pay | Admitting: Family Medicine

## 2019-02-04 ENCOUNTER — Other Ambulatory Visit: Payer: Self-pay

## 2019-02-04 DIAGNOSIS — Z20822 Contact with and (suspected) exposure to covid-19: Secondary | ICD-10-CM

## 2019-02-04 DIAGNOSIS — U071 COVID-19: Secondary | ICD-10-CM | POA: Diagnosis not present

## 2019-02-04 DIAGNOSIS — Z20828 Contact with and (suspected) exposure to other viral communicable diseases: Secondary | ICD-10-CM | POA: Diagnosis not present

## 2019-02-04 NOTE — Progress Notes (Signed)
   Subjective:  Audio plus video  Patient ID: Bailey Sullivan, female    DOB: [birth year], 38 y.o.   MRN: [id number]  HPI Pt states that husband tested positive for COVID last week. She stared showing symptoms on Sunday evening. Symptoms include tightness in chest, short of breath, congestion, fatigue and fever of 99.5. pt has history of asthma. Pt was tested for COVID yesterday at [facility].  Virtual Visit via Video Note  I connected with Bailey Sullivan on 02/04/19 at 10:00 AM EDT by a video enabled telemedicine application and verified that I am speaking with the correct person using two identifiers.  Location: Patient: home Provider: office   I discussed the limitations of evaluation and management by telemedicine and the availability of in person appointments. The patient expressed understanding and agreed to proceed.  History of Present Illness:    Observations/Objective:   Assessment and Plan:   Follow Up Instructions:    I discussed the assessment and treatment plan with the patient. The patient was provided an opportunity to ask questions and all were answered. The patient agreed with the plan and demonstrated an understanding of the instructions.   The patient was advised to call back or seek an in-person evaluation if the symptoms worsen or if the condition fails to improve as anticipated.  I provided 25 minutes of non-face-to-face time during this encounter.   Vicente Males, LPN Patient's husband was tested positive for COVID-19.  He acquired this through his church family, which apparently unfortunately not all to go to church there follow public health precautions  Patient's husband has been symptomatic since last week.  Patient does have asthma.  She uses daily Breo for control.  Patient has noted headache.  Sore throat.  Fever.  Cough.  Wheezing at times.  Using the rescue inhaler primarily during the evening.  Not using during the day  No noticeable shortness  of breath  Appetite decent Review of Systems No vomiting no diarrhea no rash    Objective:   Physical Exam  Virtual      Assessment & Plan:  Impression probable COVID-19 infection.  Discussed.  Patient has already test.  At [facility].  Unfortunately has asthma.  Warning signs discussed carefully.  Recommend purchase of an O2 sat monitor rationale discussed.  Proper use discussed.  Warning signs discussed.  Symptom care discussed.  Numerous questions answered

## 2019-02-05 LAB — NOVEL CORONAVIRUS, NAA: SARS-CoV-2, NAA: DETECTED — AB

## 2019-04-01 DIAGNOSIS — L709 Acne, unspecified: Secondary | ICD-10-CM | POA: Diagnosis not present

## 2019-05-11 ENCOUNTER — Encounter: Payer: Self-pay | Admitting: Family Medicine

## 2019-07-09 DIAGNOSIS — Z23 Encounter for immunization: Secondary | ICD-10-CM | POA: Diagnosis not present

## 2019-08-06 DIAGNOSIS — Z23 Encounter for immunization: Secondary | ICD-10-CM | POA: Diagnosis not present

## 2019-10-05 ENCOUNTER — Encounter: Payer: Self-pay | Admitting: Women's Health

## 2019-10-05 ENCOUNTER — Ambulatory Visit (INDEPENDENT_AMBULATORY_CARE_PROVIDER_SITE_OTHER): Payer: BC Managed Care – PPO | Admitting: Women's Health

## 2019-10-05 ENCOUNTER — Other Ambulatory Visit (HOSPITAL_COMMUNITY)
Admission: RE | Admit: 2019-10-05 | Discharge: 2019-10-05 | Disposition: A | Discharge status: Home / Self Care | Payer: BC Managed Care – PPO | Source: Ambulatory Visit | Attending: Obstetrics and Gynecology | Admitting: Obstetrics and Gynecology

## 2019-10-05 VITALS — BP 149/93 | HR 113 | Ht 62.0 in | Wt 147.4 lb

## 2019-10-05 DIAGNOSIS — N6452 Nipple discharge: Secondary | ICD-10-CM

## 2019-10-05 DIAGNOSIS — Z01419 Encounter for gynecological examination (general) (routine) without abnormal findings: Secondary | ICD-10-CM

## 2019-10-05 DIAGNOSIS — Z1239 Encounter for other screening for malignant neoplasm of breast: Secondary | ICD-10-CM

## 2019-10-05 DIAGNOSIS — N841 Polyp of cervix uteri: Secondary | ICD-10-CM

## 2019-10-05 DIAGNOSIS — Z803 Family history of malignant neoplasm of breast: Secondary | ICD-10-CM

## 2019-10-05 DIAGNOSIS — R030 Elevated blood-pressure reading, without diagnosis of hypertension: Secondary | ICD-10-CM

## 2019-10-05 NOTE — Addendum Note (Signed)
Addended by: Octaviano Glow on: 10/05/2019 05:00 PM   Modules accepted: Orders

## 2019-10-05 NOTE — Patient Instructions (Addendum)
Check bp daily, keep log, take to Dr. Wolfgang Phoenix in July  Keep an eye on Lt nipple  [facility]- call and schedule mammogram

## 2019-10-05 NOTE — Progress Notes (Signed)
WELL-WOMAN EXAMINATION Patient name: Bailey Sullivan MRN [id number]  Date of birth: 1980-09-26 Chief Complaint:   Annual Exam  History of Present Illness:   Bailey Sullivan is a 39 y.o. [phone] Caucasian female being seen today for a routine well-woman exam.  Current complaints: period started 1wk early in April and lasted x 8d which is unusual for her. Have been normal since. Got Covid shot in April.  BP elevated today, does have h/o GHTN and pp HTN (2018), reports bp's have been normal (had insurance bp check last year at home). Has appt in July w/ PCP. Does not have home bp cuff, but ok w/ getting one.   Depression screen [facility] 2/9 10/05/2019 01/11/2017 03/12/2016  Decreased Interest 0 0 0  Down, Depressed, Hopeless 0 0 1  PHQ - 2 Score 0 0 1  Altered sleeping 0 - -  Tired, decreased energy 0 - -  Change in appetite 0 - -  Feeling bad or failure about yourself  0 - -  Trouble concentrating 0 - -  Moving slowly or fidgety/restless 0 - -  Suicidal thoughts 0 - -  PHQ-9 Score 0 - -     PCP: [place], has appt in July      does not desire labs No LMP recorded. The current method of family planning is husband has vasectomy Last pap 01/11/17. Results were: normal. H/O abnormal pap: no Last mammogram: 2010. Results were: normal. Family h/o breast cancer: yes mom dx @ 38yo-still living, maternal aunt dx in 25s- died this 29-Apr-2022 from a differnt type of cancer at 67yo. Pt's mom had BRCA testing and was neg.  Pt's Tyrer-Cuzick risk calculated today, lifetime risk 22.70%    Last colonoscopy: never. Results were: N/A. Family h/o colorectal cancer: no Review of Systems:   Pertinent items are noted in HPI Denies any headaches, blurred vision, fatigue, shortness of breath, chest pain, abdominal pain, abnormal vaginal discharge/itching/odor/irritation, problems with periods, bowel movements, urination, or intercourse unless otherwise stated above. Pertinent History Reviewed:  Reviewed past  medical,surgical, social and family history.  Reviewed problem list, medications and allergies. Physical Assessment:   Vitals:   10/05/19 1341  BP: (!) 149/93  Pulse: (!) 113  Weight: 147 lb 6.4 oz (66.9 kg)  Height: _0  (1.575 m)  Body mass index is 26.96 kg/m.        Physical Examination:   General appearance - well appearing, and in no distress  Mental status - alert, oriented to person, place, and time  Psych:  She has a normal mood and affect  Skin - warm and dry, normal color, no suspicious lesions noted  Chest - effort normal, all lung fields clear to auscultation bilaterally  Heart - normal rate and regular rhythm  Neck:  midline trachea, no thyromegaly or nodules  Breasts - breasts appear normal, no suspicious masses, no skin or nipple changes or axillary nodes. Lt nipple w/ white d/c adherent to nipple, able to remove w/ q-tip, non-painful, non-erythematous, no itching per pt  Abdomen - soft, nontender, nondistended, no masses or organomegaly  Pelvic - VULVA: normal appearing vulva with no masses, tenderness or lesions  VAGINA: normal appearing vagina with normal color and discharge, no lesions  CERVIX: normal appearing cervix without discharge, does have ~0.5cm cervical polyp, removed w/o difficulty w/ ring forceps, no CMT  Thin prep pap is done w/ HR HPV cotesting  UTERUS: uterus is felt to be normal size, shape, consistency and nontender  ADNEXA: No adnexal masses or tenderness noted.  Extremities:  No swelling or varicosities noted  Chaperone: Latisha Cresenzo    No results found for this or any previous visit (from the past 24 hour(s)).  Assessment & Plan:  1) Well-Woman Exam  2) Elevated bp> h/o GHTN and PPHTN, to get home bp cuff and check once daily, keep log and take to appt w/ PCP in July  3) Strong family h/o breast cancer> mom @ 35yo, maternal aunt in 73s, pt's Tyrer-Cuzick lifetime risk 22.70%, per current recommendations needs yearly mammogram and  breast MRI. Last mammogram in 2010 and was normal. Orders placed at [facility] and note routed to Tish to schedule.   4) Cervical polyp> removed and sent for pathology  5) Left breast discharge> thick white, was adherent to nipple but removed easily, ? yeast- no sx, pt to keep an eye on it  Labs/procedures today: pap, cervical polyp removal  Mammogram-screening now  Colonoscopy _0  or sooner if problems  Orders Placed This Encounter  Procedures  . MM 3D SCREEN BREAST BILATERAL  . MR BREAST BILATERAL WO CONTRAST    Meds: No orders of the defined types were placed in this encounter.   Follow-up: Return in about 1 year (around 10/04/2020) for Physical.  [place], [facility] 10/05/2019 3:26 PM

## 2019-10-07 ENCOUNTER — Telehealth: Payer: Self-pay | Admitting: *Deleted

## 2019-10-07 NOTE — Telephone Encounter (Signed)
LMOVM that she is scheduled at the [facility] on 10/16/2019  2:40 PM.

## 2019-10-08 LAB — CYTOLOGY - PAP
Chlamydia: NEGATIVE
Comment: NEGATIVE
Comment: NEGATIVE
Comment: NORMAL
Diagnosis: NEGATIVE
High risk HPV: NEGATIVE
Neisseria Gonorrhea: NEGATIVE

## 2019-10-09 ENCOUNTER — Ambulatory Visit: Payer: BC Managed Care – PPO

## 2019-10-11 ENCOUNTER — Encounter: Payer: Self-pay | Admitting: Women's Health

## 2019-10-11 DIAGNOSIS — N841 Polyp of cervix uteri: Secondary | ICD-10-CM | POA: Insufficient documentation

## 2019-10-13 ENCOUNTER — Telehealth: Payer: Self-pay | Admitting: Family Medicine

## 2019-10-13 NOTE — Telephone Encounter (Addendum)
The [employer] only shows Tdap and Flu vaccinations. 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 Patient will bring [place] that copy with the form she needs filled out for school.

## 2019-10-13 NOTE — Telephone Encounter (Signed)
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 Pt also needs form completed letting them know what shots have been completed.   Pt [phone]

## 2019-10-15 DIAGNOSIS — L709 Acne, unspecified: Secondary | ICD-10-CM | POA: Diagnosis not present

## 2019-10-16 ENCOUNTER — Ambulatory Visit
Admission: RE | Admit: 2019-10-16 | Discharge: 2019-10-16 | Disposition: A | Discharge status: Home / Self Care | Payer: BC Managed Care – PPO | Source: Ambulatory Visit | Attending: Women's Health | Admitting: Women's Health

## 2019-10-16 ENCOUNTER — Other Ambulatory Visit: Payer: Self-pay

## 2019-10-16 DIAGNOSIS — Z1239 Encounter for other screening for malignant neoplasm of breast: Secondary | ICD-10-CM

## 2019-10-16 DIAGNOSIS — Z1231 Encounter for screening mammogram for malignant neoplasm of breast: Secondary | ICD-10-CM | POA: Diagnosis not present

## 2019-10-16 DIAGNOSIS — Z803 Family history of malignant neoplasm of breast: Secondary | ICD-10-CM

## 2019-11-11 ENCOUNTER — Ambulatory Visit
Admission: RE | Admit: 2019-11-11 | Discharge: 2019-11-11 | Disposition: A | Discharge status: Home / Self Care | Payer: BC Managed Care – PPO | Source: Ambulatory Visit | Attending: Women's Health | Admitting: Women's Health

## 2019-11-11 DIAGNOSIS — Z803 Family history of malignant neoplasm of breast: Secondary | ICD-10-CM

## 2019-11-11 DIAGNOSIS — Z1239 Encounter for other screening for malignant neoplasm of breast: Secondary | ICD-10-CM

## 2019-11-11 DIAGNOSIS — N6489 Other specified disorders of breast: Secondary | ICD-10-CM | POA: Diagnosis not present

## 2019-11-11 MED ORDER — GADOBUTROL 1 MMOL/ML IV SOLN
6.0000 mL | Freq: Once | INTRAVENOUS | Status: AC | PRN
  Administered 2019-11-11: 6 mL via INTRAVENOUS

## 2020-01-16 ENCOUNTER — Other Ambulatory Visit (INDEPENDENT_AMBULATORY_CARE_PROVIDER_SITE_OTHER): Payer: BC Managed Care – PPO

## 2020-01-16 DIAGNOSIS — Z23 Encounter for immunization: Secondary | ICD-10-CM | POA: Diagnosis not present

## 2020-03-14 DIAGNOSIS — L709 Acne, unspecified: Secondary | ICD-10-CM | POA: Diagnosis not present

## 2020-08-11 DIAGNOSIS — L709 Acne, unspecified: Secondary | ICD-10-CM | POA: Diagnosis not present

## 2020-09-12 ENCOUNTER — Other Ambulatory Visit: Payer: Self-pay | Admitting: Women's Health

## 2020-09-12 DIAGNOSIS — Z1231 Encounter for screening mammogram for malignant neoplasm of breast: Secondary | ICD-10-CM

## 2020-11-04 ENCOUNTER — Ambulatory Visit: Payer: BC Managed Care – PPO

## 2020-12-23 ENCOUNTER — Other Ambulatory Visit: Payer: Self-pay

## 2020-12-23 ENCOUNTER — Ambulatory Visit
Admission: RE | Admit: 2020-12-23 | Discharge: 2020-12-23 | Disposition: A | Discharge status: Home / Self Care | Payer: BC Managed Care – PPO | Source: Ambulatory Visit | Attending: Women's Health | Admitting: Women's Health

## 2020-12-23 DIAGNOSIS — Z1231 Encounter for screening mammogram for malignant neoplasm of breast: Secondary | ICD-10-CM | POA: Diagnosis not present

## 2020-12-30 ENCOUNTER — Other Ambulatory Visit: Payer: Self-pay | Admitting: Women's Health

## 2020-12-30 DIAGNOSIS — R928 Other abnormal and inconclusive findings on diagnostic imaging of breast: Secondary | ICD-10-CM

## 2021-01-02 ENCOUNTER — Ambulatory Visit
Admission: RE | Admit: 2021-01-02 | Discharge: 2021-01-02 | Disposition: A | Discharge status: Home / Self Care | Payer: BC Managed Care – PPO | Source: Ambulatory Visit | Attending: Women's Health | Admitting: Women's Health

## 2021-01-02 ENCOUNTER — Other Ambulatory Visit: Payer: Self-pay

## 2021-01-02 DIAGNOSIS — R922 Inconclusive mammogram: Secondary | ICD-10-CM | POA: Diagnosis not present

## 2021-01-02 DIAGNOSIS — R928 Other abnormal and inconclusive findings on diagnostic imaging of breast: Secondary | ICD-10-CM

## 2021-01-16 ENCOUNTER — Other Ambulatory Visit: Payer: BC Managed Care – PPO

## 2021-01-31 ENCOUNTER — Other Ambulatory Visit: Payer: Self-pay | Admitting: Women's Health

## 2021-01-31 DIAGNOSIS — Z1239 Encounter for other screening for malignant neoplasm of breast: Secondary | ICD-10-CM

## 2021-01-31 DIAGNOSIS — Z9189 Other specified personal risk factors, not elsewhere classified: Secondary | ICD-10-CM

## 2021-02-13 DIAGNOSIS — L709 Acne, unspecified: Secondary | ICD-10-CM | POA: Diagnosis not present

## 2021-06-21 ENCOUNTER — Other Ambulatory Visit: Payer: Self-pay

## 2021-06-21 ENCOUNTER — Ambulatory Visit
Admission: RE | Admit: 2021-06-21 | Discharge: 2021-06-21 | Disposition: A | Discharge status: Home / Self Care | Payer: BC Managed Care – PPO | Source: Ambulatory Visit | Attending: Women's Health | Admitting: Women's Health

## 2021-06-21 DIAGNOSIS — Z9189 Other specified personal risk factors, not elsewhere classified: Secondary | ICD-10-CM

## 2021-06-21 DIAGNOSIS — Z1239 Encounter for other screening for malignant neoplasm of breast: Secondary | ICD-10-CM

## 2021-06-21 DIAGNOSIS — R928 Other abnormal and inconclusive findings on diagnostic imaging of breast: Secondary | ICD-10-CM | POA: Diagnosis not present

## 2021-06-21 MED ORDER — GADOBUTROL 1 MMOL/ML IV SOLN
6.0000 mL | Freq: Once | INTRAVENOUS | Status: AC | PRN
  Administered 2021-06-21: 6 mL via INTRAVENOUS

## 2021-06-23 ENCOUNTER — Other Ambulatory Visit: Payer: Self-pay | Admitting: Women's Health

## 2021-06-23 DIAGNOSIS — R9389 Abnormal findings on diagnostic imaging of other specified body structures: Secondary | ICD-10-CM

## 2021-06-28 ENCOUNTER — Ambulatory Visit
Admission: RE | Admit: 2021-06-28 | Discharge: 2021-06-28 | Disposition: A | Discharge status: Home / Self Care | Payer: BC Managed Care – PPO | Source: Ambulatory Visit | Attending: Women's Health | Admitting: Women's Health

## 2021-06-28 ENCOUNTER — Other Ambulatory Visit: Payer: Self-pay | Admitting: Radiology

## 2021-06-28 DIAGNOSIS — N6341 Unspecified lump in right breast, subareolar: Secondary | ICD-10-CM | POA: Diagnosis not present

## 2021-06-28 DIAGNOSIS — N6011 Diffuse cystic mastopathy of right breast: Secondary | ICD-10-CM | POA: Diagnosis not present

## 2021-06-28 DIAGNOSIS — R9389 Abnormal findings on diagnostic imaging of other specified body structures: Secondary | ICD-10-CM

## 2021-06-28 DIAGNOSIS — N6314 Unspecified lump in the right breast, lower inner quadrant: Secondary | ICD-10-CM | POA: Diagnosis not present

## 2021-06-28 DIAGNOSIS — N6453 Retraction of nipple: Secondary | ICD-10-CM | POA: Diagnosis not present

## 2021-06-28 HISTORY — PX: BREAST BIOPSY: SHX20

## 2021-07-03 ENCOUNTER — Encounter: Payer: Self-pay | Admitting: Women's Health

## 2021-07-06 ENCOUNTER — Ambulatory Visit
Admission: RE | Admit: 2021-07-06 | Discharge: 2021-07-06 | Disposition: A | Discharge status: Home / Self Care | Payer: BC Managed Care – PPO | Source: Ambulatory Visit | Attending: Women's Health | Admitting: Women's Health

## 2021-07-06 DIAGNOSIS — R928 Other abnormal and inconclusive findings on diagnostic imaging of breast: Secondary | ICD-10-CM | POA: Diagnosis not present

## 2021-07-06 DIAGNOSIS — N6011 Diffuse cystic mastopathy of right breast: Secondary | ICD-10-CM | POA: Diagnosis not present

## 2021-07-06 DIAGNOSIS — R9389 Abnormal findings on diagnostic imaging of other specified body structures: Secondary | ICD-10-CM

## 2021-07-06 DIAGNOSIS — N62 Hypertrophy of breast: Secondary | ICD-10-CM | POA: Diagnosis not present

## 2021-07-06 HISTORY — PX: BREAST BIOPSY: SHX20

## 2021-07-06 MED ORDER — GADOBUTROL 1 MMOL/ML IV SOLN
6.0000 mL | Freq: Once | INTRAVENOUS | Status: AC | PRN
  Administered 2021-07-06: 6 mL via INTRAVENOUS

## 2021-08-16 DIAGNOSIS — L709 Acne, unspecified: Secondary | ICD-10-CM | POA: Diagnosis not present

## 2021-12-13 ENCOUNTER — Other Ambulatory Visit: Payer: Self-pay | Admitting: Women's Health

## 2021-12-13 DIAGNOSIS — Z1231 Encounter for screening mammogram for malignant neoplasm of breast: Secondary | ICD-10-CM

## 2021-12-26 ENCOUNTER — Ambulatory Visit
Admission: RE | Admit: 2021-12-26 | Discharge: 2021-12-26 | Disposition: A | Discharge status: Home / Self Care | Payer: BC Managed Care – PPO | Source: Ambulatory Visit | Attending: Women's Health | Admitting: Women's Health

## 2021-12-26 DIAGNOSIS — Z1231 Encounter for screening mammogram for malignant neoplasm of breast: Secondary | ICD-10-CM

## 2022-02-14 DIAGNOSIS — L709 Acne, unspecified: Secondary | ICD-10-CM | POA: Diagnosis not present

## 2022-06-12 ENCOUNTER — Other Ambulatory Visit: Payer: Self-pay | Admitting: Women's Health

## 2022-06-12 DIAGNOSIS — N6489 Other specified disorders of breast: Secondary | ICD-10-CM

## 2022-06-19 ENCOUNTER — Encounter: Payer: Self-pay | Admitting: Women's Health

## 2022-07-02 ENCOUNTER — Ambulatory Visit
Admission: RE | Admit: 2022-07-02 | Discharge: 2022-07-02 | Disposition: A | Discharge status: Home / Self Care | Payer: BC Managed Care – PPO | Source: Ambulatory Visit | Attending: Women's Health | Admitting: Women's Health

## 2022-07-02 DIAGNOSIS — N6489 Other specified disorders of breast: Secondary | ICD-10-CM

## 2022-07-02 MED ORDER — GADOPICLENOL 0.5 MMOL/ML IV SOLN
7.0000 mL | Freq: Once | INTRAVENOUS | Status: AC | PRN
  Administered 2022-07-02: 7 mL via INTRAVENOUS

## 2022-07-03 ENCOUNTER — Other Ambulatory Visit: Payer: Self-pay | Admitting: Women's Health

## 2022-07-03 DIAGNOSIS — R9389 Abnormal findings on diagnostic imaging of other specified body structures: Secondary | ICD-10-CM

## 2022-07-05 ENCOUNTER — Encounter: Payer: Self-pay | Admitting: Women's Health

## 2022-07-10 ENCOUNTER — Other Ambulatory Visit: Payer: Self-pay | Admitting: Women's Health

## 2022-07-10 MED ORDER — LORAZEPAM 1 MG PO TABS: 1.0000 mg | ORAL_TABLET | Freq: Three times a day (TID) | ORAL | 0 refills | Status: AC | PRN

## 2022-07-12 ENCOUNTER — Ambulatory Visit
Admission: RE | Admit: 2022-07-12 | Discharge: 2022-07-12 | Disposition: A | Discharge status: Home / Self Care | Payer: BC Managed Care – PPO | Source: Ambulatory Visit | Attending: Women's Health | Admitting: Women's Health

## 2022-07-12 DIAGNOSIS — R9389 Abnormal findings on diagnostic imaging of other specified body structures: Secondary | ICD-10-CM

## 2022-07-12 MED ORDER — GADOPICLENOL 0.5 MMOL/ML IV SOLN
7.0000 mL | Freq: Once | INTRAVENOUS | Status: AC | PRN
  Administered 2022-07-12: 7 mL via INTRAVENOUS

## 2022-10-23 IMAGING — MR MR BREAST BX W/ LOC DEV 1ST LEASION IMAGE BX SPEC MR GUIDE*R*
8 of 12 series · 30 of 48 positions shown · IV contrast (6 ml multihance)
Comparison: Previous exams.
COMPARISON: Previous exams.

Addendum:
CLINICAL DATA: 1.7 cm linear non mass enhancement in the lower
outer right breast middle depth on a recent screening breast MRI
four calculated lifetime risk of developing breast cancer greater
than 20%. Her mother was diagnosed with breast cancer at age 38.
There is also a 1.0 cm mass in the lower inner periareolar right
breast on the screening MRI. This was biopsied under ultrasound
guidance demonstrating fibrocystic changes.

EXAM:
MRI GUIDED CORE NEEDLE BIOPSY OF THE RIGHT BREAST X 2
TECHNIQUE: Multiplanar, multisequence MR imaging of the right breast was
performed both before and after administration of intravenous
contrast.
CONTRAST:  6mL GADAVIST GADOBUTROL 1 MMOL/ML IV SOLN

[Series 2: fiducial unilateral · sagittal · 2.0mm · 1.33mm/px · 3 of 52 slices shown]
[im 1/52]
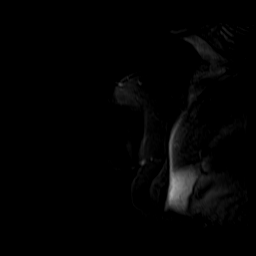
[im 26/52]
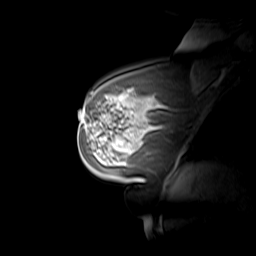
[im 52/52]
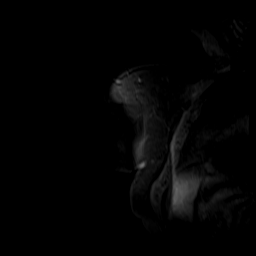

[Series 3: dynamic pre · axial · non-contrast · 1.3mm · 0.73mm/px · z∈[-53,+133]mm · 5 of 144 slices shown]
[im 1/144]
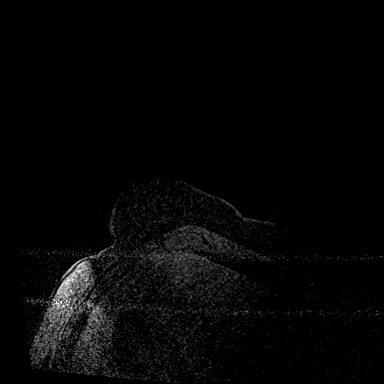
[im 36/144]
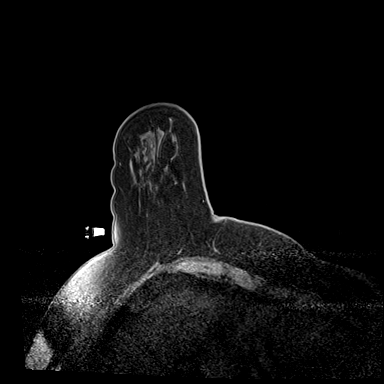
[im 72/144]
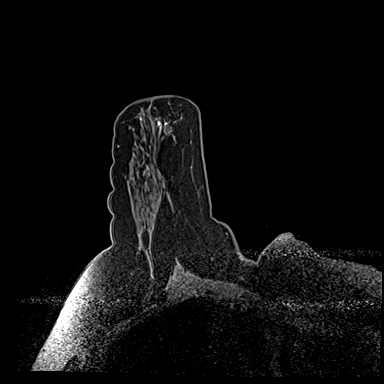
[im 108/144]
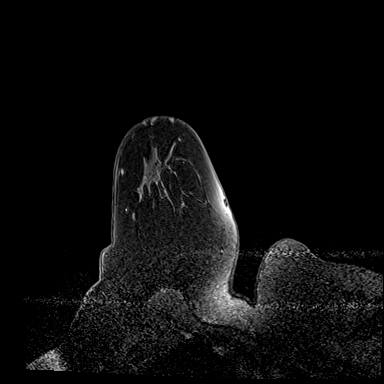
[im 144/144]
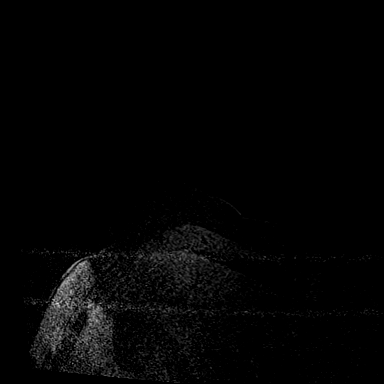

[Series 4: dynamic post 20 · axial · 1.3mm · 0.73mm/px · z∈[-53,+133]mm · 4 of 144 slices shown (1 of 2)]
[im 1/144]
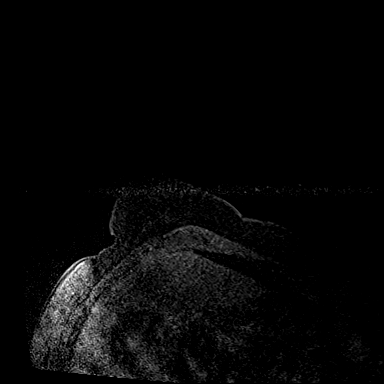
[im 48/144]
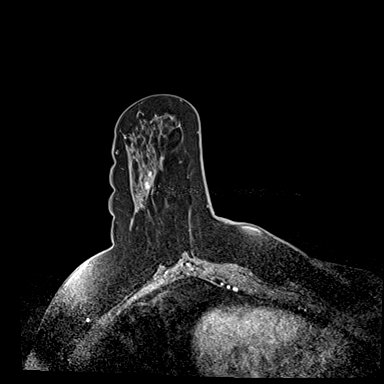
[im 96/144]
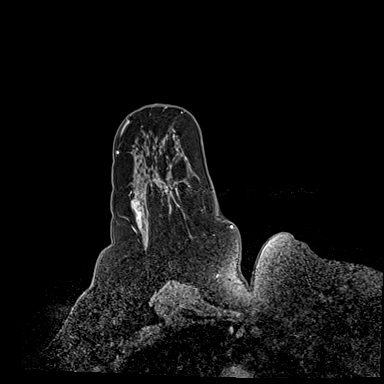
[im 144/144]
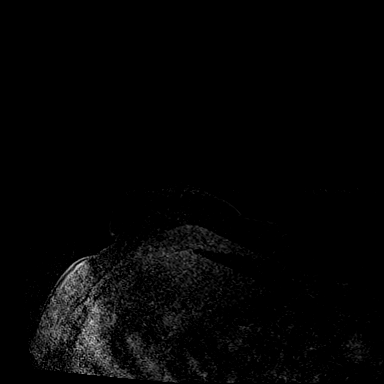

[Series 5: dynamic post 20 · axial · 1.3mm · 0.73mm/px · z∈[-53,+133]mm · 4 of 144 slices shown (2 of 2)]
[im 1/144]
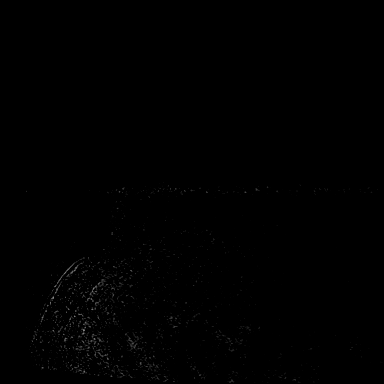
[im 48/144]
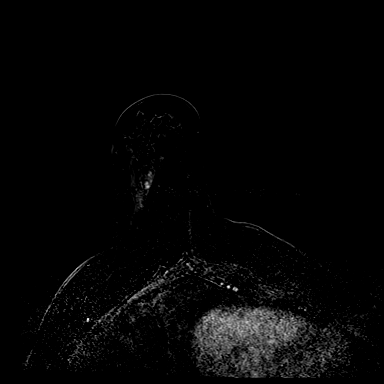
[im 96/144]
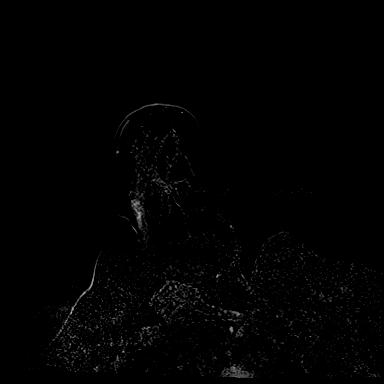
[im 144/144]
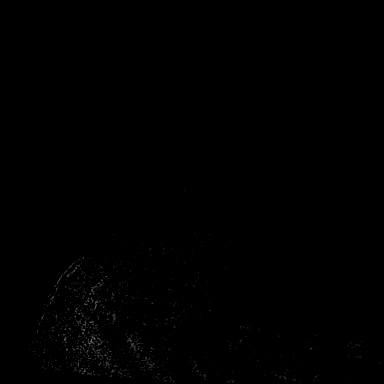

[Series 6: dynamic post 3 · axial · 1.3mm · 0.73mm/px · z∈[-53,+133]mm · 4 of 144 slices shown (1 of 2)]
[im 1/144]
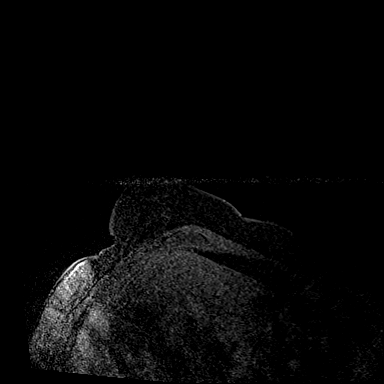
[im 48/144]
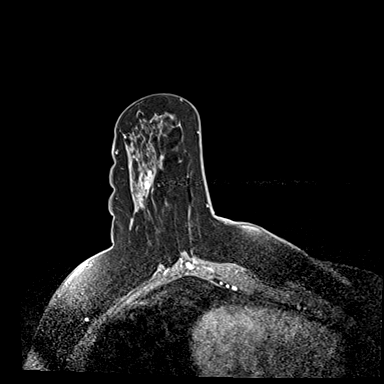
[im 96/144]
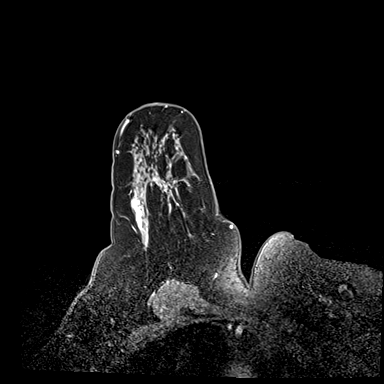
[im 144/144]
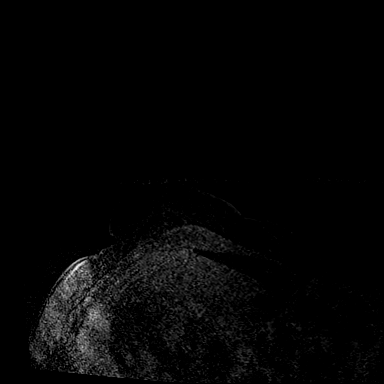

[Series 7: dynamic post 3 · axial · 1.3mm · 0.73mm/px · z∈[-53,+133]mm · 4 of 144 slices shown (2 of 2)]
[im 1/144]
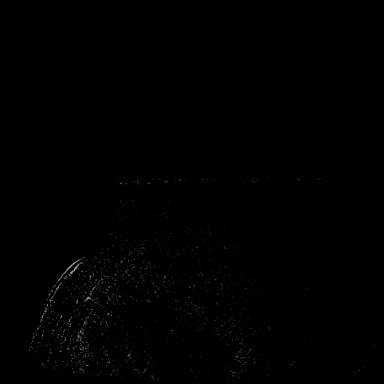
[im 48/144]
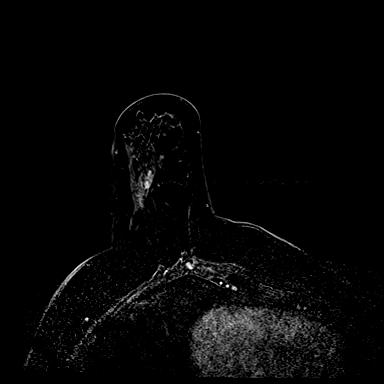
[im 96/144]
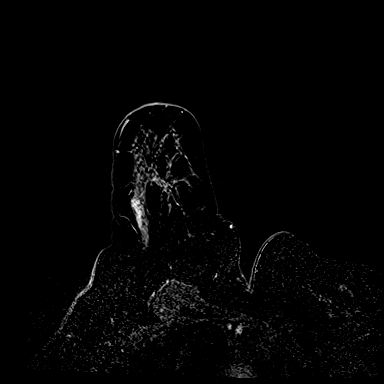
[im 144/144]
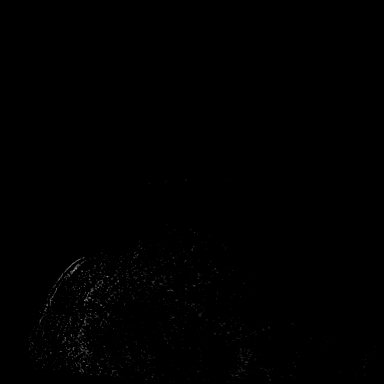

[Series 8: needle confirmation · axial · 1.3mm · 0.73mm/px · z∈[-53,+133]mm · 4 of 144 slices shown]
[im 1/144]
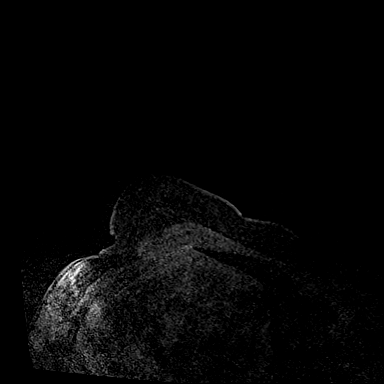
[im 48/144]
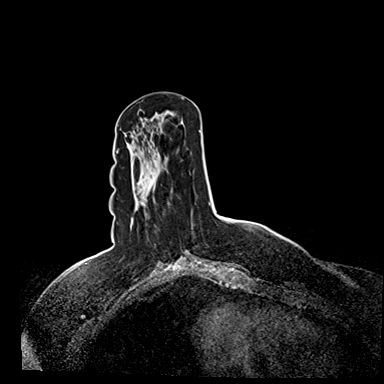
[im 96/144]
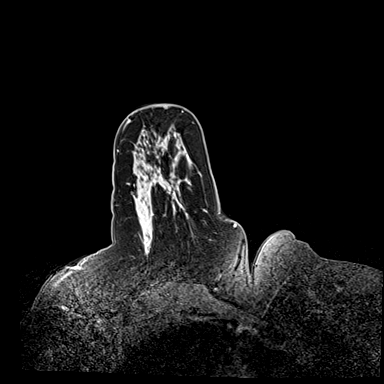
[im 144/144]
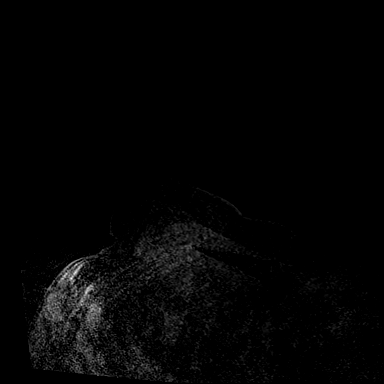

[Series 9: needle confirmation_sub · axial · 1.3mm · 0.73mm/px · z∈[-53,+8]mm · 2 of 144 slices shown]
[im 1/144]
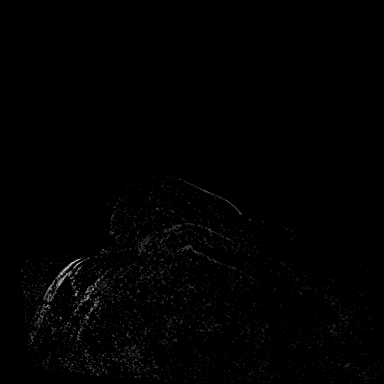
[im 48/144]
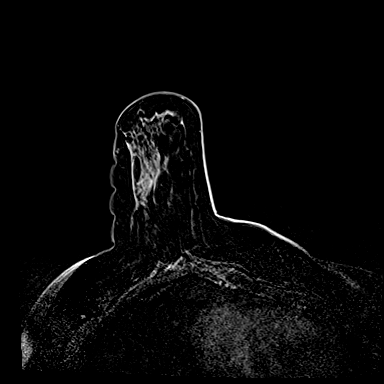

[30 of 48 positions shown; findings below may reference images not displayed]

FINDINGS: I met with the patient, and we discussed the procedure of MRI guided
biopsy, including risks, benefits, and alternatives. Specifically,
we discussed the risks of infection, bleeding, tissue injury, clip
migration, and inadequate sampling. Informed, written consent was
given. The usual time out protocol was performed immediately prior
to the procedures.

SITE 1: POSTERIOR ASPECT OF THE 1.7 CM AREA OF LINEAR NON MASS
ENHANCEMENT IN THE LOWER OUTER CENTRAL RIGHT BREAST

Preliminary images of the right breast demonstrated a biopsy marker
clip artifact at the posterior aspect of the previously biopsied
cm mass in the lower inner periareolar right breast.

Using sterile technique, 1% Lidocaine, MRI guidance, and a 9 gauge
vacuum assisted device, biopsy was performed of posterior aspect of
the 1.7 cm linear area of non mass enhancement in the lower outer
central right breast using a lateral approach. At the conclusion of
the procedure, a cylinder shaped tissue marker clip was deployed
into the biopsy cavity.

SITE 2: ANTERIOR ASPECT OF THE 1.7 CM AREA OF LINEAR NON MASS
ENHANCEMENT IN THE LOWER OUTER CENTRAL RIGHT BREAST

Using sterile technique, 1% Lidocaine, MRI guidance, and a 9 gauge
vacuum assisted device, biopsy was performed of the anterior aspect
of the 1.7 cm area of linear non mass enhancement in the lower outer
central right breast using a lateral approach. At the conclusion of
the procedure, a barbell shaped tissue marker clip was deployed into
the biopsy cavity.

Follow-up 2-view mammogram was performed and dictated separately.
IMPRESSION: MRI guided biopsy of the posterior and anterior portions of the
cm linear area of non mass enhancement in the lower outer central
right breast. No apparent complications.

ADDENDUM:
Pathology revealed PSEUDOANGIOMATOUS STROMAL HYPERPLASIA of the
RIGHT breast, posterior aspect, lower outer quadrant, central,
(cylinder clip). This was found to be concordant by Dr. Nazareth Jumper.

Pathology revealed FIBROCYSTIC CHANGES, PSEUDOANGIOMATOUS STROMAL
HYPERPLASIA of the RIGHT breast, anterior aspect, lower outer
quadrant, central, (barbell clip). This was found to be concordant
by Dr. Nazareth Jumper.

Pathology results were discussed with the patient by telephone. The
patient reported doing well after the biopsies with tenderness at
the sites. Post biopsy instructions and care were reviewed and
questions were answered. The patient was encouraged to call The
direct phone number was provided.

The patient was instructed to return for a bilateral breast MRI in 6
months, per protocol, and to return for annual screening mammography
in December 2021.

Pathology results reported by Mesam Helo, RN on 07/07/2021.

*** End of Addendum ***
FINDINGS: I met with the patient, and we discussed the procedure of MRI guided
biopsy, including risks, benefits, and alternatives. Specifically,
we discussed the risks of infection, bleeding, tissue injury, clip
migration, and inadequate sampling. Informed, written consent was
given. The usual time out protocol was performed immediately prior
to the procedures.

SITE 1: POSTERIOR ASPECT OF THE 1.7 CM AREA OF LINEAR NON MASS
ENHANCEMENT IN THE LOWER OUTER CENTRAL RIGHT BREAST

Preliminary images of the right breast demonstrated a biopsy marker
clip artifact at the posterior aspect of the previously biopsied
cm mass in the lower inner periareolar right breast.

Using sterile technique, 1% Lidocaine, MRI guidance, and a 9 gauge
vacuum assisted device, biopsy was performed of posterior aspect of
the 1.7 cm linear area of non mass enhancement in the lower outer
central right breast using a lateral approach. At the conclusion of
the procedure, a cylinder shaped tissue marker clip was deployed
into the biopsy cavity.

SITE 2: ANTERIOR ASPECT OF THE 1.7 CM AREA OF LINEAR NON MASS
ENHANCEMENT IN THE LOWER OUTER CENTRAL RIGHT BREAST

Using sterile technique, 1% Lidocaine, MRI guidance, and a 9 gauge
vacuum assisted device, biopsy was performed of the anterior aspect
of the 1.7 cm area of linear non mass enhancement in the lower outer
central right breast using a lateral approach. At the conclusion of
the procedure, a barbell shaped tissue marker clip was deployed into
the biopsy cavity.

Follow-up 2-view mammogram was performed and dictated separately.
IMPRESSION: MRI guided biopsy of the posterior and anterior portions of the
cm linear area of non mass enhancement in the lower outer central
right breast. No apparent complications.
